# Patient Record
Sex: Female | Born: 1985 | Race: White | Hispanic: No | Marital: Married | Smoking: Never smoker
Health system: Southern US, Community
[De-identification: ages and names within clinical notes are randomized; demographics above are authoritative.]

## PROBLEM LIST (undated history)

## (undated) ENCOUNTER — Inpatient Hospital Stay (HOSPITAL_COMMUNITY): Payer: Self-pay

## (undated) DIAGNOSIS — D649 Anemia, unspecified: Secondary | ICD-10-CM

## (undated) DIAGNOSIS — Z87440 Personal history of urinary (tract) infections: Secondary | ICD-10-CM

## (undated) DIAGNOSIS — R87629 Unspecified abnormal cytological findings in specimens from vagina: Secondary | ICD-10-CM

## (undated) HISTORY — PX: NO PAST SURGERIES: SHX2092

---

## 2015-05-06 ENCOUNTER — Emergency Department (HOSPITAL_COMMUNITY): Payer: 59 | Admitting: Anesthesiology

## 2015-05-06 ENCOUNTER — Emergency Department (HOSPITAL_COMMUNITY): Payer: 59

## 2015-05-06 ENCOUNTER — Observation Stay (HOSPITAL_COMMUNITY)
Admission: EM | Admit: 2015-05-06 | Discharge: 2015-05-07 | Disposition: A | Discharge status: Home / Self Care | Payer: 59 | Attending: General Surgery | Admitting: General Surgery

## 2015-05-06 ENCOUNTER — Encounter (HOSPITAL_COMMUNITY): Payer: Self-pay | Admitting: Emergency Medicine

## 2015-05-06 ENCOUNTER — Encounter (HOSPITAL_COMMUNITY)
Admission: EM | Disposition: A | Discharge status: Home / Self Care | Payer: Self-pay | Source: Home / Self Care | Attending: Emergency Medicine

## 2015-05-06 DIAGNOSIS — K3580 Unspecified acute appendicitis: Principal | ICD-10-CM | POA: Insufficient documentation

## 2015-05-06 DIAGNOSIS — Z9049 Acquired absence of other specified parts of digestive tract: Secondary | ICD-10-CM

## 2015-05-06 HISTORY — DX: Personal history of urinary (tract) infections: Z87.440

## 2015-05-06 HISTORY — PX: APPENDECTOMY: SHX54

## 2015-05-06 HISTORY — PX: LAPAROSCOPIC APPENDECTOMY: SHX408

## 2015-05-06 LAB — URINALYSIS, ROUTINE W REFLEX MICROSCOPIC
GLUCOSE, UA: NEGATIVE mg/dL
Hgb urine dipstick: NEGATIVE
Leukocytes, UA: NEGATIVE
Nitrite: NEGATIVE
PH: 6.5 (ref 5.0–8.0)
Protein, ur: NEGATIVE mg/dL
Specific Gravity, Urine: 1.022 (ref 1.005–1.030)
Urobilinogen, UA: 1 mg/dL (ref 0.0–1.0)

## 2015-05-06 LAB — CBC
HCT: 41.9 % (ref 36.0–46.0)
Hemoglobin: 14 g/dL (ref 12.0–15.0)
MCH: 31 pg (ref 26.0–34.0)
MCHC: 33.4 g/dL (ref 30.0–36.0)
MCV: 92.7 fL (ref 78.0–100.0)
Platelets: 162 10*3/uL (ref 150–400)
RBC: 4.52 MIL/uL (ref 3.87–5.11)
RDW: 12.5 % (ref 11.5–15.5)
WBC: 5.9 10*3/uL (ref 4.0–10.5)

## 2015-05-06 LAB — COMPREHENSIVE METABOLIC PANEL WITH GFR
ALT: 23 U/L (ref 14–54)
AST: 30 U/L (ref 15–41)
Albumin: 3.8 g/dL (ref 3.5–5.0)
Alkaline Phosphatase: 52 U/L (ref 38–126)
Anion gap: 13 (ref 5–15)
BUN: <5 mg/dL — ABNORMAL LOW (ref 6–20)
CO2: 22 mmol/L (ref 22–32)
Calcium: 9.3 mg/dL (ref 8.9–10.3)
Chloride: 105 mmol/L (ref 101–111)
Creatinine, Ser: 0.76 mg/dL (ref 0.44–1.00)
GFR calc Af Amer: >60 mL/min (ref >60)
GFR calc non Af Amer: >60 mL/min (ref >60)
Glucose, Bld: 88 mg/dL (ref 65–99)
Potassium: 3.8 mmol/L (ref 3.5–5.1)
Sodium: 140 mmol/L (ref 135–145)
Total Bilirubin: 0.7 mg/dL (ref 0.3–1.2)
Total Protein: 6.6 g/dL (ref 6.5–8.1)

## 2015-05-06 LAB — I-STAT BETA HCG BLOOD, ED (MC, WL, AP ONLY): I-stat hCG, quantitative: <5 m[IU]/mL (ref <5)

## 2015-05-06 LAB — LIPASE, BLOOD: Lipase: 22 U/L (ref 11–51)

## 2015-05-06 SURGERY — APPENDECTOMY, LAPAROSCOPIC
Anesthesia: General | Site: Abdomen

## 2015-05-06 MED ORDER — SUCCINYLCHOLINE CHLORIDE 20 MG/ML IJ SOLN
INTRAMUSCULAR | Status: DC | PRN
  Administered 2015-05-06: 80 mg via INTRAVENOUS

## 2015-05-06 MED ORDER — PANTOPRAZOLE SODIUM 40 MG IV SOLR
40.0000 mg | Freq: Every day | INTRAVENOUS | Status: DC
  Administered 2015-05-06: 40 mg via INTRAVENOUS
  Filled 2015-05-06: qty 40

## 2015-05-06 MED ORDER — ONDANSETRON 4 MG PO TBDP
4.0000 mg | ORAL_TABLET | Freq: Four times a day (QID) | ORAL | Status: DC | PRN
  Administered 2015-05-07: 4 mg via ORAL
  Filled 2015-05-06: qty 1

## 2015-05-06 MED ORDER — ONDANSETRON HCL 4 MG/2ML IJ SOLN: 4.0000 mg | Freq: Four times a day (QID) | INTRAMUSCULAR | Status: DC | PRN

## 2015-05-06 MED ORDER — HYDROMORPHONE HCL 1 MG/ML IJ SOLN
INTRAMUSCULAR | Status: AC
  Filled 2015-05-06: qty 1

## 2015-05-06 MED ORDER — LACTATED RINGERS IV SOLN
INTRAVENOUS | Status: DC
  Administered 2015-05-06: 18:00:00 via INTRAVENOUS

## 2015-05-06 MED ORDER — ARTIFICIAL TEARS OP OINT
TOPICAL_OINTMENT | OPHTHALMIC | Status: DC | PRN
  Administered 2015-05-06: 1 via OPHTHALMIC

## 2015-05-06 MED ORDER — KETOROLAC TROMETHAMINE 30 MG/ML IJ SOLN
30.0000 mg | Freq: Once | INTRAMUSCULAR | Status: AC
  Administered 2015-05-06: 30 mg via INTRAVENOUS

## 2015-05-06 MED ORDER — MIDAZOLAM HCL 5 MG/5ML IJ SOLN
INTRAMUSCULAR | Status: DC | PRN
  Administered 2015-05-06: 2 mg via INTRAVENOUS

## 2015-05-06 MED ORDER — OXYCODONE HCL 5 MG PO TABS
5.0000 mg | ORAL_TABLET | ORAL | Status: DC | PRN
  Administered 2015-05-06: 5 mg via ORAL
  Administered 2015-05-07 (×4): 10 mg via ORAL
  Filled 2015-05-06 (×4): qty 2

## 2015-05-06 MED ORDER — ROCURONIUM BROMIDE 100 MG/10ML IV SOLN
INTRAVENOUS | Status: DC | PRN
  Administered 2015-05-06: 20 mg via INTRAVENOUS

## 2015-05-06 MED ORDER — DIPHENHYDRAMINE HCL 50 MG/ML IJ SOLN: 25.0000 mg | Freq: Four times a day (QID) | INTRAMUSCULAR | Status: DC | PRN

## 2015-05-06 MED ORDER — LACTATED RINGERS IV SOLN
INTRAVENOUS | Status: DC | PRN
  Administered 2015-05-06: 18:00:00 via INTRAVENOUS

## 2015-05-06 MED ORDER — HYDROMORPHONE HCL 1 MG/ML IJ SOLN
0.2500 mg | INTRAMUSCULAR | Status: DC | PRN
  Administered 2015-05-06 (×4): 0.5 mg via INTRAVENOUS

## 2015-05-06 MED ORDER — DIPHENHYDRAMINE HCL 25 MG PO CAPS: 25.0000 mg | ORAL_CAPSULE | Freq: Four times a day (QID) | ORAL | Status: DC | PRN

## 2015-05-06 MED ORDER — MONTELUKAST SODIUM 10 MG PO TABS
10.0000 mg | ORAL_TABLET | Freq: Every day | ORAL | Status: DC
  Administered 2015-05-06: 10 mg via ORAL
  Filled 2015-05-06: qty 1

## 2015-05-06 MED ORDER — LIDOCAINE HCL (CARDIAC) 20 MG/ML IV SOLN
INTRAVENOUS | Status: DC | PRN
  Administered 2015-05-06: 60 mg via INTRAVENOUS

## 2015-05-06 MED ORDER — BUPIVACAINE-EPINEPHRINE (PF) 0.25% -1:200000 IJ SOLN
INTRAMUSCULAR | Status: AC
  Filled 2015-05-06: qty 30

## 2015-05-06 MED ORDER — DEXAMETHASONE SODIUM PHOSPHATE 10 MG/ML IJ SOLN
INTRAMUSCULAR | Status: AC
  Filled 2015-05-06: qty 1

## 2015-05-06 MED ORDER — NEOSTIGMINE METHYLSULFATE 10 MG/10ML IV SOLN
INTRAVENOUS | Status: DC | PRN
  Administered 2015-05-06: 4 mg via INTRAVENOUS

## 2015-05-06 MED ORDER — KCL IN DEXTROSE-NACL 20-5-0.45 MEQ/L-%-% IV SOLN
INTRAVENOUS | Status: AC
  Filled 2015-05-06: qty 1000

## 2015-05-06 MED ORDER — IOHEXOL 300 MG/ML  SOLN
80.0000 mL | Freq: Once | INTRAMUSCULAR | Status: AC | PRN
  Administered 2015-05-06: 100 mL via INTRAVENOUS

## 2015-05-06 MED ORDER — OXYCODONE HCL 5 MG PO TABS
ORAL_TABLET | ORAL | Status: AC
  Filled 2015-05-06: qty 1

## 2015-05-06 MED ORDER — KCL IN DEXTROSE-NACL 20-5-0.45 MEQ/L-%-% IV SOLN
INTRAVENOUS | Status: DC
  Administered 2015-05-06: 20:00:00 via INTRAVENOUS

## 2015-05-06 MED ORDER — FENTANYL CITRATE (PF) 100 MCG/2ML IJ SOLN
INTRAMUSCULAR | Status: DC | PRN
  Administered 2015-05-06: 100 ug via INTRAVENOUS
  Administered 2015-05-06 (×3): 50 ug via INTRAVENOUS

## 2015-05-06 MED ORDER — DEXTROSE 5 % IV SOLN
1.0000 g | INTRAVENOUS | Status: DC
  Administered 2015-05-06: 1 g via INTRAVENOUS
  Filled 2015-05-06 (×2): qty 10

## 2015-05-06 MED ORDER — METRONIDAZOLE IN NACL 5-0.79 MG/ML-% IV SOLN
500.0000 mg | Freq: Three times a day (TID) | INTRAVENOUS | Status: DC
  Administered 2015-05-06 – 2015-05-07 (×3): 500 mg via INTRAVENOUS
  Filled 2015-05-06 (×5): qty 100

## 2015-05-06 MED ORDER — NEOSTIGMINE METHYLSULFATE 10 MG/10ML IV SOLN
INTRAVENOUS | Status: AC
  Filled 2015-05-06: qty 1

## 2015-05-06 MED ORDER — ACETAMINOPHEN 650 MG RE SUPP: 650.0000 mg | Freq: Four times a day (QID) | RECTAL | Status: DC | PRN

## 2015-05-06 MED ORDER — KETOROLAC TROMETHAMINE 30 MG/ML IJ SOLN
INTRAMUSCULAR | Status: AC
  Filled 2015-05-06: qty 1

## 2015-05-06 MED ORDER — ACETAMINOPHEN 325 MG PO TABS
650.0000 mg | ORAL_TABLET | Freq: Four times a day (QID) | ORAL | Status: DC | PRN
  Administered 2015-05-06: 650 mg via ORAL
  Filled 2015-05-06: qty 2

## 2015-05-06 MED ORDER — GLYCOPYRROLATE 0.2 MG/ML IJ SOLN
INTRAMUSCULAR | Status: DC | PRN
  Administered 2015-05-06: 0.6 mg via INTRAVENOUS

## 2015-05-06 MED ORDER — METHOCARBAMOL 500 MG PO TABS
500.0000 mg | ORAL_TABLET | Freq: Four times a day (QID) | ORAL | Status: DC | PRN
  Administered 2015-05-06 – 2015-05-07 (×2): 500 mg via ORAL
  Filled 2015-05-06 (×2): qty 1

## 2015-05-06 MED ORDER — PROPOFOL 10 MG/ML IV BOLUS
INTRAVENOUS | Status: AC
  Filled 2015-05-06: qty 20

## 2015-05-06 MED ORDER — FENTANYL CITRATE (PF) 250 MCG/5ML IJ SOLN
INTRAMUSCULAR | Status: AC
  Filled 2015-05-06: qty 5

## 2015-05-06 MED ORDER — BUPIVACAINE-EPINEPHRINE 0.25% -1:200000 IJ SOLN
INTRAMUSCULAR | Status: DC | PRN
  Administered 2015-05-06: 14 mL

## 2015-05-06 MED ORDER — MORPHINE SULFATE (PF) 2 MG/ML IV SOLN: 2.0000 mg | INTRAVENOUS | Status: DC | PRN

## 2015-05-06 MED ORDER — MIDAZOLAM HCL 2 MG/2ML IJ SOLN
INTRAMUSCULAR | Status: AC
  Filled 2015-05-06: qty 4

## 2015-05-06 MED ORDER — SODIUM CHLORIDE 0.9 % IR SOLN
Status: DC | PRN
  Administered 2015-05-06: 1000 mL

## 2015-05-06 MED ORDER — ENOXAPARIN SODIUM 40 MG/0.4ML ~~LOC~~ SOLN
40.0000 mg | SUBCUTANEOUS | Status: DC
  Administered 2015-05-07: 40 mg via SUBCUTANEOUS
  Filled 2015-05-06: qty 0.4

## 2015-05-06 MED ORDER — PROPOFOL 10 MG/ML IV BOLUS
INTRAVENOUS | Status: DC | PRN
  Administered 2015-05-06: 150 mg via INTRAVENOUS

## 2015-05-06 MED ORDER — DEXAMETHASONE SODIUM PHOSPHATE 10 MG/ML IJ SOLN
INTRAMUSCULAR | Status: DC | PRN
  Administered 2015-05-06: 10 mg via INTRAVENOUS

## 2015-05-06 MED ORDER — PROMETHAZINE HCL 25 MG/ML IJ SOLN: 6.2500 mg | INTRAMUSCULAR | Status: DC | PRN

## 2015-05-06 MED ORDER — ONDANSETRON HCL 4 MG/2ML IJ SOLN
INTRAMUSCULAR | Status: DC | PRN
  Administered 2015-05-06: 4 mg via INTRAVENOUS

## 2015-05-06 MED ORDER — GLYCOPYRROLATE 0.2 MG/ML IJ SOLN
INTRAMUSCULAR | Status: AC
  Filled 2015-05-06: qty 3

## 2015-05-06 MED ORDER — NORGESTIMATE-ETH ESTRADIOL 0.25-35 MG-MCG PO TABS: 1.0000 | ORAL_TABLET | Freq: Every day | ORAL | Status: DC

## 2015-05-06 MED ORDER — SODIUM CHLORIDE 0.9 % IV BOLUS (SEPSIS)
1000.0000 mL | Freq: Once | INTRAVENOUS | Status: AC
  Administered 2015-05-06: 1000 mL via INTRAVENOUS

## 2015-05-06 MED ORDER — ONDANSETRON HCL 4 MG/2ML IJ SOLN
INTRAMUSCULAR | Status: AC
  Filled 2015-05-06: qty 2

## 2015-05-06 SURGICAL SUPPLY — 42 items
APPLIER CLIP ROT 10 11.4 M/L (STAPLE)
BLADE SURG ROTATE 9660 (MISCELLANEOUS) IMPLANT
CANISTER SUCTION 2500CC (MISCELLANEOUS) ×3 IMPLANT
CHLORAPREP W/TINT 26ML (MISCELLANEOUS) ×3 IMPLANT
CLIP APPLIE ROT 10 11.4 M/L (STAPLE) IMPLANT
COVER SURGICAL LIGHT HANDLE (MISCELLANEOUS) ×3 IMPLANT
CUTTER LINEAR ENDO 35 ART THIN (STAPLE) ×3 IMPLANT
CUTTER LINEAR ENDO 35 ETS (STAPLE) IMPLANT
DECANTER SPIKE VIAL GLASS SM (MISCELLANEOUS) ×3 IMPLANT
ELECT REM PT RETURN 9FT ADLT (ELECTROSURGICAL) ×3
ELECTRODE REM PT RTRN 9FT ADLT (ELECTROSURGICAL) ×1 IMPLANT
GLOVE BIO SURGEON STRL SZ8 (GLOVE) ×3 IMPLANT
GLOVE BIOGEL PI IND STRL 7.0 (GLOVE) ×1 IMPLANT
GLOVE BIOGEL PI IND STRL 8 (GLOVE) ×1 IMPLANT
GLOVE BIOGEL PI INDICATOR 7.0 (GLOVE) ×2
GLOVE BIOGEL PI INDICATOR 8 (GLOVE) ×2
GOWN STRL REUS W/ TWL LRG LVL3 (GOWN DISPOSABLE) ×2 IMPLANT
GOWN STRL REUS W/ TWL XL LVL3 (GOWN DISPOSABLE) ×1 IMPLANT
GOWN STRL REUS W/TWL LRG LVL3 (GOWN DISPOSABLE) ×4
GOWN STRL REUS W/TWL XL LVL3 (GOWN DISPOSABLE) ×2
KIT BASIN OR (CUSTOM PROCEDURE TRAY) ×3 IMPLANT
KIT ROOM TURNOVER OR (KITS) ×3 IMPLANT
LIQUID BAND (GAUZE/BANDAGES/DRESSINGS) ×3 IMPLANT
NEEDLE 22X1 1/2 (OR ONLY) (NEEDLE) ×3 IMPLANT
NS IRRIG 1000ML POUR BTL (IV SOLUTION) ×3 IMPLANT
PAD ARMBOARD 7.5X6 YLW CONV (MISCELLANEOUS) ×6 IMPLANT
POUCH SPECIMEN RETRIEVAL 10MM (ENDOMECHANICALS) ×3 IMPLANT
RELOAD /EVU35 (ENDOMECHANICALS) IMPLANT
RELOAD CUTTER ETS 35MM STAND (ENDOMECHANICALS) IMPLANT
SCALPEL HARMONIC ACE (MISCELLANEOUS) ×3 IMPLANT
SET IRRIG TUBING LAPAROSCOPIC (IRRIGATION / IRRIGATOR) ×3 IMPLANT
SLEEVE ENDOPATH XCEL 5M (ENDOMECHANICALS) ×3 IMPLANT
SPECIMEN JAR SMALL (MISCELLANEOUS) ×3 IMPLANT
SUT VIC AB 4-0 PS2 27 (SUTURE) ×3 IMPLANT
TOWEL OR 17X24 6PK STRL BLUE (TOWEL DISPOSABLE) ×3 IMPLANT
TOWEL OR 17X26 10 PK STRL BLUE (TOWEL DISPOSABLE) ×3 IMPLANT
TRAY FOLEY CATH 16FR SILVER (SET/KITS/TRAYS/PACK) ×3 IMPLANT
TRAY LAPAROSCOPIC MC (CUSTOM PROCEDURE TRAY) ×3 IMPLANT
TROCAR XCEL 12X100 BLDLESS (ENDOMECHANICALS) ×3 IMPLANT
TROCAR XCEL BLUNT TIP 100MML (ENDOMECHANICALS) ×3 IMPLANT
TROCAR XCEL NON-BLD 5MMX100MML (ENDOMECHANICALS) ×3 IMPLANT
TUBING INSUFFLATION (TUBING) ×3 IMPLANT

## 2015-05-06 NOTE — Anesthesia Procedure Notes (Signed)
Procedure Name: Intubation Date/Time: 05/06/2015 6:33 PM Performed by: Molli HazardGORDON, Keyaira Clapham M Pre-anesthesia Checklist: Patient identified, Timeout performed, Emergency Drugs available, Suction available and Patient being monitored Patient Re-evaluated:Patient Re-evaluated prior to inductionOxygen Delivery Method: Circle system utilized Preoxygenation: Pre-oxygenation with 100% oxygen Intubation Type: IV induction, Rapid sequence and Cricoid Pressure applied Laryngoscope Size: Mac and 3 Grade View: Grade I Tube type: Oral Tube size: 7.0 mm Number of attempts: 1 Airway Equipment and Method: Stylet Placement Confirmation: ETT inserted through vocal cords under direct vision,  breath sounds checked- equal and bilateral and positive ETCO2 Secured at: 21 cm Tube secured with: Tape Dental Injury: Teeth and Oropharynx as per pre-operative assessment

## 2015-05-06 NOTE — Progress Notes (Signed)
Received pt from PACU via stretcher, awake

## 2015-05-06 NOTE — Anesthesia Preprocedure Evaluation (Signed)
Anesthesia Evaluation  Patient identified by MRN, date of birth, ID band Patient awake    Reviewed: Allergy & Precautions, NPO status , Patient's Chart, lab work & pertinent test results  Airway Mallampati: II  TM Distance: >3 FB Neck ROM: Full    Dental no notable dental hx.    Pulmonary neg pulmonary ROS,    Pulmonary exam normal breath sounds clear to auscultation       Cardiovascular negative cardio ROS Normal cardiovascular exam Rhythm:Regular Rate:Normal     Neuro/Psych negative neurological ROS  negative psych ROS   GI/Hepatic negative GI ROS, Neg liver ROS,   Endo/Other  negative endocrine ROS  Renal/GU negative Renal ROS  negative genitourinary   Musculoskeletal negative musculoskeletal ROS (+)   Abdominal   Peds negative pediatric ROS (+)  Hematology negative hematology ROS (+)   Anesthesia Other Findings   Reproductive/Obstetrics negative OB ROS                             Anesthesia Physical Anesthesia Plan  ASA: I  Anesthesia Plan: General   Post-op Pain Management:    Induction: Intravenous and Rapid sequence  Airway Management Planned: Oral ETT  Additional Equipment:   Intra-op Plan:   Post-operative Plan: Extubation in OR  Informed Consent: I have reviewed the patients History and Physical, chart, labs and discussed the procedure including the risks, benefits and alternatives for the proposed anesthesia with the patient or authorized representative who has indicated his/her understanding and acceptance.   Dental advisory given  Plan Discussed with: CRNA and Surgeon  Anesthesia Plan Comments:         Anesthesia Quick Evaluation  

## 2015-05-06 NOTE — Op Note (Signed)
05/06/2015  7:27 PM  PATIENT:  Diane [name]  29 y.o. female  PRE-OPERATIVE DIAGNOSIS:  Acute appendicitis  POST-OPERATIVE DIAGNOSIS:  Acute appendicitis  PROCEDURE:  Procedure(s): APPENDECTOMY LAPAROSCOPIC  SURGEON:  Surgeon(s): Violeta GelinasBurke Neveah Bang, MD  ASSISTANTS: none   ANESTHESIA:   local and general  EBL:  Total I/O In: -  Out: 325 [Urine:300; Blood:25]  BLOOD ADMINISTERED:none  DRAINS: none   SPECIMEN:  Excision  DISPOSITION OF SPECIMEN:  PATHOLOGY  COUNTS:  YES  DICTATION: .Dragon Dictation Findings: Acutely inflamed appendix without perforation  Procedure in detail: Patient was identified in the preop holding area. Informed consent was obtained. She received intravenous antibiotics. She was brought to the operative room and general endotracheal anesthesia was administered by the anesthesia staff. Foley catheter was placed by nursing. Abdomen was prepped and draped in sterile fashion. Time out procedure was performed.The infraumbilical region was infiltrated with local. Infraumbilical incision was made. Subcutaneous tissues were dissected down revealing the anterior fascia. This was divided sharply along the midline. Peritoneal cavity was entered under direct vision without complication. A 0 Vicryl pursestring was placed around the fascial opening. Hassan trocar was inserted into the abdomen. The abdomen was insufflated with carbon dioxide in standard fashion. Under direct vision a 5 mm left lower quadrant and 5 mm right mid abdomen port were placed. Local was used at each port site. Laparoscopic exploration revealed an acutely inflamed, but not perforated appendix. The mesoappendix was divided with the harmonic scalpel achieving excellent hemostasis. The base of the appendix was then divided with Endo GIA achieving an excellent staple line. The appendix was placed in a bag and removed from the abdomen. The abdomen was copiously irrigated and the irrigation returned clear.  Staple line was intact. There was no bleeding. Ports were removed under direct vision. Pneumoperitoneum was released. Infraumbilical fascia was closed by tying the pursestring. All 3 wounds were copiously irrigated, hemostasis was ensured, and then closed with running 4-0 Vicryl subcuticular followed by liquid band. All counts were correct. Patient tolerated procedure well without apparent complications and was taken recovery in stable condition.  PATIENT DISPOSITION:  PACU - hemodynamically stable.   Delay start of Pharmacological VTE agent (>24hrs) due to surgical blood loss or risk of bleeding:  no  Violeta GelinasBurke Lamyia Cdebaca, MD, MPH, FACS Pager: [phone]  10/31/20167:27 PM

## 2015-05-06 NOTE — Interval H&P Note (Signed)
History and Physical Interval Note:Please see Dr. Tawana ScaleWilson's H&P. I examined her & agree with his assessment and plan. Acute appendicitis. For laparoscopic appendectomy. Procedure, risks, and benefits discussed with her and she agrees.  05/06/2015 6:12 PM  Diane [name]  has presented today for surgery, with the diagnosis of Appendicitis  The various methods of treatment have been discussed with the patient and family. After consideration of risks, benefits and other options for treatment, the patient has consented to  Procedure(s): APPENDECTOMY LAPAROSCOPIC (N/A) as a surgical intervention .  The patient's history has been reviewed, patient examined, no change in status, stable for surgery.  I have reviewed the patient's chart and labs.  Questions were answered to the patient's satisfaction.     Diane [name] E

## 2015-05-06 NOTE — H&P (Signed)
Diane [name] is an 29 y.o. female.   Chief Complaint: abd pain HPI: 29 yo wf who developed periumbilical pain yesterday while traveling back from a wedding in [place] state. Pain has been constant and progressive. +nausea. This am pain became localized to RLQ and came to ED. Had some sweats last night. Driving to ED and hitting bumps, etc made pain worse. No prior symptoms. Has had a cough as well past several days - no fever, some productive sputum. No sore throat. Last bm yesterday. Normal urination. No discharge.   Just takes OCP and allergy med  History reviewed. No pertinent past medical history.  History reviewed. No pertinent past surgical history.  No family history on file. Social History:  reports that she has never smoked. She does not have any smokeless tobacco history on file. She reports that she drinks alcohol. Her drug history is not on file.  Allergies: No Known Allergies   (Not in a hospital admission)  Results for orders placed or performed during the hospital encounter of 05/06/15 (from the past 48 hour(s))  Lipase, blood     Status: None   Collection Time: 05/06/15 11:20 AM  Result Value Ref Range   Lipase 22 11 - 51 U/L    Comment: Please note change in reference range.  Comprehensive metabolic panel     Status: Abnormal   Collection Time: 05/06/15 11:20 AM  Result Value Ref Range   Sodium 140 135 - 145 mmol/L   Potassium 3.8 3.5 - 5.1 mmol/L   Chloride 105 101 - 111 mmol/L   CO2 22 22 - 32 mmol/L   Glucose, Bld 88 65 - 99 mg/dL   BUN <5 (L) 6 - 20 mg/dL   Creatinine, Ser 0.76 0.44 - 1.00 mg/dL   Calcium 9.3 8.9 - 10.3 mg/dL   Total Protein 6.6 6.5 - 8.1 g/dL   Albumin 3.8 3.5 - 5.0 g/dL   AST 30 15 - 41 U/L   ALT 23 14 - 54 U/L   Alkaline Phosphatase 52 38 - 126 U/L   Total Bilirubin 0.7 0.3 - 1.2 mg/dL   GFR calc non Af Amer >60 >60 mL/min   GFR calc Af Amer >60 >60 mL/min    Comment: (NOTE) The eGFR has been calculated using the CKD EPI  equation. This calculation has not been validated in all clinical situations. eGFR's persistently <60 mL/min signify possible Chronic Kidney Disease.    Anion gap 13 5 - 15  CBC     Status: None   Collection Time: 05/06/15 11:20 AM  Result Value Ref Range   WBC 5.9 4.0 - 10.5 K/uL   RBC 4.52 3.87 - 5.11 MIL/uL   Hemoglobin 14.0 12.0 - 15.0 g/dL   HCT 41.9 36.0 - 46.0 %   MCV 92.7 78.0 - 100.0 fL   MCH 31.0 26.0 - 34.0 pg   MCHC 33.4 30.0 - 36.0 g/dL   RDW 12.5 11.5 - 15.5 %   Platelets 162 150 - 400 K/uL  I-Stat beta hCG blood, ED ([facility], [facility], [facility] only)     Status: None   Collection Time: 05/06/15 11:20 AM  Result Value Ref Range   I-stat hCG, quantitative <5.0 <5 mIU/mL   Comment 3            Comment:   GEST. AGE      CONC.  (mIU/mL)   <=1 WEEK        5 - 50  2 WEEKS       50 - 500     3 WEEKS       100 - 10,000     4 WEEKS     1,000 - 30,000        FEMALE AND NON-PREGNANT FEMALE:     LESS THAN 5 mIU/mL   Urinalysis, Routine w reflex microscopic (not at [facility])     Status: Abnormal   Collection Time: 05/06/15 12:37 PM  Result Value Ref Range   Color, Urine YELLOW YELLOW   APPearance CLEAR CLEAR   Specific Gravity, Urine 1.022 1.005 - 1.030   pH 6.5 5.0 - 8.0   Glucose, UA NEGATIVE NEGATIVE mg/dL   Hgb urine dipstick NEGATIVE NEGATIVE   Bilirubin Urine SMALL (A) NEGATIVE   Ketones, ur >80 (A) NEGATIVE mg/dL   Protein, ur NEGATIVE NEGATIVE mg/dL   Urobilinogen, UA 1.0 0.0 - 1.0 mg/dL   Nitrite NEGATIVE NEGATIVE   Leukocytes, UA NEGATIVE NEGATIVE    Comment: MICROSCOPIC NOT DONE ON URINES WITH NEGATIVE PROTEIN, BLOOD, LEUKOCYTES, NITRITE, OR GLUCOSE <1000 mg/dL.   Ct Abdomen Pelvis W Contrast  05/06/2015  CLINICAL DATA:  Initial encounter for right lower quadrant abdominal pain since last night. EXAM: CT ABDOMEN AND PELVIS WITH CONTRAST TECHNIQUE: Multidetector CT imaging of the abdomen and pelvis was performed using the standard protocol following bolus administration of  intravenous contrast. CONTRAST:  131m OMNIPAQUE IOHEXOL 300 MG/ML  SOLN COMPARISON:  None. FINDINGS: Lower chest:  Unremarkable. Hepatobiliary: No focal abnormality within the liver parenchyma. There is no evidence for gallstones, gallbladder wall thickening, or pericholecystic fluid. No intrahepatic or extrahepatic biliary dilation. Pancreas: No focal mass lesion. No dilatation of the main duct. No intraparenchymal cyst. No peripancreatic edema. Spleen: No splenomegaly. No focal mass lesion. Adrenals/Urinary Tract: No adrenal nodule or mass. Kidneys are normal bilaterally. No evidence for hydroureter. The urinary bladder appears normal for the degree of distention. Stomach/Bowel: Stomach is nondistended. No gastric wall thickening. No evidence of outlet obstruction. Duodenum is normally positioned as is the ligament of Treitz. No small bowel wall thickening. No small bowel dilatation. Terminal ileum is not well seen. The appendix appears thickened, measuring up to 9 mm in diameter. Appendiceal wall is ill-defined and there appears to be slight hypovolemia in the wall of the appendix. Appendix is straightened and well seen on coronal images 23 and 24 of series 5. No gross colonic mass. No colonic wall thickening. No substantial diverticular change. Vascular/Lymphatic: No abdominal aortic aneurysm. No abdominal atherosclerotic calcification. There is no gastrohepatic or hepatoduodenal ligament lymphadenopathy. No intraperitoneal or retroperitoneal lymphadenopy. No pelvic sidewall lymphadenopathy. Reproductive: Uterus is unremarkable.  There is no adnexal mass. Other: Trace free fluid is seen in the cul-de-sac. Musculoskeletal: Bone windows reveal no worrisome lytic or sclerotic osseous lesions. IMPRESSION: Mild thickening of the appendiceal diameter associated with a straightened configuration and ill definition of the wall. Together, these imaging features are suspicious for acute appendicitis. No evidence for  perforation or abscess at this time. I discussed these findings by telephone with the LJohnn Hai, the PA caring for the patient, the as , slow at 1624 hours on 05/06/2015. Electronically Signed   By: EMisty StanleyM.D.   On: 05/06/2015 16:24    Review of Systems  Constitutional: Negative for weight loss.       Clammy  HENT: Negative for nosebleeds.   Eyes: Negative for blurred vision.  Respiratory: Positive for cough and sputum production. Negative for  shortness of breath.   Cardiovascular: Negative for chest pain, palpitations, orthopnea and PND.       Denies DOE  Gastrointestinal: Positive for nausea and abdominal pain.  Genitourinary: Negative for dysuria, urgency, frequency and hematuria.  Musculoskeletal: Negative.   Skin: Negative for itching and rash.  Neurological: Negative for dizziness, focal weakness, seizures, loss of consciousness and headaches.  Endo/Heme/Allergies: Does not bruise/bleed easily.  Psychiatric/Behavioral: The patient is not nervous/anxious.     Blood pressure 108/68, pulse 76, temperature 98.5 F (36.9 C), temperature source Oral, resp. rate 16, height '5\' 3"'  (1.6 m), weight 52.164 kg (115 lb), SpO2 99 %. Physical Exam  Vitals reviewed. Constitutional: She is oriented to person, place, and time. She appears well-developed and well-nourished. No distress.  Nontoxic, occasional cough  HENT:  Head: Normocephalic and atraumatic.  Right Ear: External ear normal.  Left Ear: External ear normal.  Eyes: Conjunctivae are normal. No scleral icterus.  Neck: Normal range of motion. Neck supple. No tracheal deviation present. No thyromegaly present.  Cardiovascular: Normal rate and normal heart sounds.   Respiratory: Effort normal and breath sounds normal. No stridor. No respiratory distress. She has no wheezes.  GI: Soft. She exhibits no distension. There is tenderness. There is guarding and tenderness at McBurney's point. There is no rigidity and no rebound. No  hernia.    Musculoskeletal: She exhibits no edema or tenderness.  Lymphadenopathy:    She has no cervical adenopathy.  Neurological: She is alert and oriented to person, place, and time. She exhibits normal muscle tone.  Skin: Skin is warm and dry. No rash noted. She is not diaphoretic. No erythema. No pallor.  Psychiatric: She has a normal mood and affect. Her behavior is normal. Judgment and thought content normal.     Assessment/Plan Acute appendicitis Cough/cold  We discussed the etiology and management of acute appendicitis. We discussed operative and nonoperative management.  I recommended operative management along with IV antibiotics.  We discussed laparoscopic appendectomy. We discussed the risk and benefits of surgery including but not limited to bleeding, infection, injury to surrounding structures, need to convert to an open procedure, blood clot formation, post operative abscess or wound infection, staple line complications such as leak or bleeding, hernia formation, post operative ileus, need for additional procedures, anesthesia complications, and the typical postoperative course. I explained that the patient should expect a good improvement in their symptoms.  Dr Grandville Silos will be doing her surgery later this pm. All of her questions asked and answered.   Leighton Ruff. Redmond Pulling, MD, FACS General, Bariatric, & Minimally Invasive Surgery [facility], [place]   [facility] M 05/06/2015, 5:42 PM

## 2015-05-06 NOTE — Transfer of Care (Signed)
Immediate Anesthesia Transfer of Care Note  Patient: Diane AgarJennifer [name]  Procedure(s) Performed: Procedure(s): APPENDECTOMY LAPAROSCOPIC (N/A)  Patient Location: PACU  Anesthesia Type:General  Level of Consciousness: awake, alert  and oriented  Airway & Oxygen Therapy: Patient connected to nasal cannula oxygen  Post-op Assessment: Report given to RN and Post -op Vital signs reviewed and stable  Post vital signs: Reviewed and stable  Last Vitals:  Filed Vitals:   05/06/15 1936  BP: 102/78  Pulse: 100  Temp: 36.4 C  Resp: 20    Complications: No apparent anesthesia complications

## 2015-05-06 NOTE — ED Notes (Signed)
Pt from home for eval of LUQ abd pain that started yesterday with nausea, denies any fever or diarrhea. States hx of UTI but feels this is different. No abd distention, LBM on 05/05/15. nad noted. Skin warm and dry.

## 2015-05-06 NOTE — Anesthesia Postprocedure Evaluation (Signed)
  Anesthesia Post-op Note  Patient: Diane AgarJennifer [name]  Procedure(s) Performed: Procedure(s) (LRB): APPENDECTOMY LAPAROSCOPIC (N/A)  Patient Location: PACU  Anesthesia Type: General  Level of Consciousness: awake and alert   Airway and Oxygen Therapy: Patient Spontanous Breathing  Post-op Pain: mild  Post-op Assessment: Post-op Vital signs reviewed, Patient's Cardiovascular Status Stable, Respiratory Function Stable, Patent Airway and No signs of Nausea or vomiting  Last Vitals:  Filed Vitals:   05/06/15 2000  BP:   Pulse: 77  Temp: 36.8 C  Resp: 18    Post-op Vital Signs: stable   Complications: No apparent anesthesia complications

## 2015-05-06 NOTE — ED Provider Notes (Signed)
CSN: [id number]     Arrival date & time 05/06/15  1101 History   First MD Initiated Contact with Patient 05/06/15 1137     Chief Complaint  Patient presents with  . Abdominal Pain     (Consider location/radiation/quality/duration/timing/severity/associated sxs/prior Treatment) Patient is a 29 y.o. female presenting with abdominal pain. The history is provided by the patient. No language interpreter was used.  Abdominal Pain Pain location:  RLQ Pain quality: aching   Pain radiates to:  Does not radiate Pain severity:  Moderate Onset quality:  Gradual Duration:  1 day Timing:  Constant Progression:  Worsening Chronicity:  New Context: not alcohol use, not previous surgeries and not recent illness   Relieved by:  Nothing Worsened by:  Nothing tried Ineffective treatments:  None tried Associated symptoms: anorexia and nausea   Associated symptoms: no vomiting   Risk factors: not pregnant    Pt reports she began having pain in lower abdomen yesterday,  Pain was more on the left but seems to be moving to the right.  History reviewed. No pertinent past medical history. History reviewed. No pertinent past surgical history. No family history on file. Social History  Substance Use Topics  . Smoking status: Never Smoker   . Smokeless tobacco: None  . Alcohol Use: Yes     Comment: occassionally   OB History    No data available     Review of Systems  Gastrointestinal: Positive for nausea, abdominal pain and anorexia. Negative for vomiting.  All other systems reviewed and are negative.     Allergies  Review of patient's allergies indicates no known allergies.  Home Medications   Prior to Admission medications   Not on File   BP 122/78 mmHg  Pulse 99  Temp(Src) 98.4 F (36.9 C) (Oral)  Resp 16  Ht 5\' 3"  (1.6 m)  Wt 115 lb (52.164 kg)  BMI 20.38 kg/m2  SpO2 100%  LMP  Physical Exam  Constitutional: She appears well-developed and well-nourished.  HENT:  Head:  Normocephalic and atraumatic.  Right Ear: External ear normal.  Left Ear: External ear normal.  Nose: Nose normal.  Mouth/Throat: Oropharynx is clear and moist.  Eyes: Conjunctivae and EOM are normal. Pupils are equal, round, and reactive to light.  Neck: Normal range of motion.  Cardiovascular: Normal rate and normal heart sounds.   Pulmonary/Chest: Effort normal and breath sounds normal.  Abdominal: Soft. There is tenderness.  Tender right lower quadrant  Musculoskeletal: Normal range of motion.  Neurological: She is alert.  Skin: Skin is warm.  Nursing note and vitals reviewed.   ED Course  Procedures (including critical care time) Labs Review Labs Reviewed  CBC  LIPASE, BLOOD  COMPREHENSIVE METABOLIC PANEL  URINALYSIS, ROUTINE W REFLEX MICROSCOPIC (NOT AT [facility])  I-STAT BETA HCG BLOOD, ED ([facility], [facility], [facility] ONLY)    Imaging Review No results found. I have personally reviewed and evaluated these images and lab results as part of my medical decision-making.   EKG Interpretation None      MDM Pt given IV fluids  Wbc's normal.   Ct scan shows appendicitis per Dr. Franchot GalloMansel.   Consult to surgery.   Dr. Andrey CampanileWilson will see here and evaluate   Final diagnoses:  Acute appendicitis, unspecified acute appendicitis type        Elson AreasLeslie K Sofia, PA-C 05/06/15 1641  Alvira MondayErin Schlossman, MD 05/06/15 2322

## 2015-05-06 NOTE — ED Notes (Signed)
Surgery MD at bedside.

## 2015-05-07 ENCOUNTER — Encounter (HOSPITAL_COMMUNITY): Payer: Self-pay | Admitting: General Practice

## 2015-05-07 LAB — BASIC METABOLIC PANEL
ANION GAP: 9 (ref 5–15)
BUN: 6 mg/dL (ref 6–20)
CALCIUM: 7.9 mg/dL — AB (ref 8.9–10.3)
CO2: 22 mmol/L (ref 22–32)
Chloride: 99 mmol/L — ABNORMAL LOW (ref 101–111)
Creatinine, Ser: 0.76 mg/dL (ref 0.44–1.00)
GFR calc Af Amer: >60 mL/min (ref >60)
GLUCOSE: 161 mg/dL — AB (ref 65–99)
POTASSIUM: 4.6 mmol/L (ref 3.5–5.1)
Sodium: 130 mmol/L — ABNORMAL LOW (ref 135–145)

## 2015-05-07 LAB — CBC
HEMATOCRIT: 34.4 % — AB (ref 36.0–46.0)
Hemoglobin: 11.5 g/dL — ABNORMAL LOW (ref 12.0–15.0)
MCH: 30.9 pg (ref 26.0–34.0)
MCHC: 33.4 g/dL (ref 30.0–36.0)
MCV: 92.5 fL (ref 78.0–100.0)
PLATELETS: 155 10*3/uL (ref 150–400)
RBC: 3.72 MIL/uL — ABNORMAL LOW (ref 3.87–5.11)
RDW: 12.3 % (ref 11.5–15.5)
WBC: 3.6 10*3/uL — ABNORMAL LOW (ref 4.0–10.5)

## 2015-05-07 MED ORDER — ONDANSETRON HCL 4 MG PO TABS: 4.0000 mg | ORAL_TABLET | Freq: Three times a day (TID) | ORAL | Status: DC | PRN

## 2015-05-07 MED ORDER — FLUCONAZOLE 100 MG PO TABS
200.0000 mg | ORAL_TABLET | Freq: Once | ORAL | Status: AC
  Administered 2015-05-07: 200 mg via ORAL
  Filled 2015-05-07: qty 2

## 2015-05-07 MED ORDER — OXYCODONE HCL 5 MG PO TABS: 5.0000 mg | ORAL_TABLET | Freq: Four times a day (QID) | ORAL | Status: DC | PRN

## 2015-05-07 NOTE — Discharge Summary (Signed)
[facility] Discharge Summary   Patient ID: Diane [name] MRN: [id number] DOB/AGE: 27-23-1987 27 y.o.  Admit date: 05/06/2015 Discharge date: 05/07/2015  Admitting Diagnosis: Acute appendicitis  Discharge Diagnosis Patient Active Problem List   Diagnosis Date Noted  . S/P laparoscopic appendectomy 05/06/2015    Consultants None  Imaging: Ct Abdomen Pelvis W Contrast  05/06/2015  CLINICAL DATA:  Initial encounter for right lower quadrant abdominal pain since last night. EXAM: CT ABDOMEN AND PELVIS WITH CONTRAST TECHNIQUE: Multidetector CT imaging of the abdomen and pelvis was performed using the standard protocol following bolus administration of intravenous contrast. CONTRAST:  OMNIPAQUE IOHEXOL 300 MG/ML  SOLN COMPARISON:  None. FINDINGS: Lower chest:  Unremarkable. Hepatobiliary: No focal abnormality within the liver parenchyma. There is no evidence for gallstones, gallbladder wall thickening, or pericholecystic fluid. No intrahepatic or extrahepatic biliary dilation. Pancreas: No focal mass lesion. No dilatation of the main duct. No intraparenchymal cyst. No peripancreatic edema. Spleen: No splenomegaly. No focal mass lesion. Adrenals/Urinary Tract: No adrenal nodule or mass. Kidneys are normal bilaterally. No evidence for hydroureter. The urinary bladder appears normal for the degree of distention. Stomach/Bowel: Stomach is nondistended. No gastric wall thickening. No evidence of outlet obstruction. Duodenum is normally positioned as is the ligament of Treitz. No small bowel wall thickening. No small bowel dilatation. Terminal ileum is not well seen. The appendix appears thickened, measuring up to 9 mm in diameter. Appendiceal wall is ill-defined and there appears to be slight hypovolemia in the wall of the appendix. Appendix is straightened and well seen on coronal images 23 and 24 of series 5. No gross colonic mass. No colonic wall thickening. No substantial  diverticular change. Vascular/Lymphatic: No abdominal aortic aneurysm. No abdominal atherosclerotic calcification. There is no gastrohepatic or hepatoduodenal ligament lymphadenopathy. No intraperitoneal or retroperitoneal lymphadenopy. No pelvic sidewall lymphadenopathy. Reproductive: Uterus is unremarkable.  There is no adnexal mass. Other: Trace free fluid is seen in the cul-de-sac. Musculoskeletal: Bone windows reveal no worrisome lytic or sclerotic osseous lesions. IMPRESSION: Mild thickening of the appendiceal diameter associated with a straightened configuration and ill definition of the wall. Together, these imaging features are suspicious for acute appendicitis. No evidence for perforation or abscess at this time. I discussed these findings by telephone with the Campbell Lerner , the PA caring for the patient, the as , slow at 1624 hours on 05/06/2015. Electronically Signed   By: Kennith Center M.D.   On: 05/06/2015 16:24    Procedures Dr. Janee Morn (05/06/15) - Laparoscopic Appendectomy  Hospital Course:  29 yo wf who developed periumbilical pain yesterday while traveling back from a wedding in [place] state. Pain has been constant and progressive. +nausea. This am pain became localized to RLQ and came to ED. Had some sweats last night. Driving to ED and hitting bumps, etc made pain worse. No prior symptoms. Has had a cough as well past several days - no fever, some productive sputum. No sore throat. Last bm yesterday. Normal urination. No discharge.  Just takes OCP and allergy med.  Workup showed acute appendicitis.  Patient was admitted and underwent procedure listed above.  Tolerated procedure well and was transferred to the floor.  Diet was advanced as tolerated.  On POD #1, the patient was voiding well, tolerating diet, ambulating well, pain well controlled, vital signs stable, incisions c/d/i and felt stable for discharge home.  Patient will follow up in our office in 2 weeks and knows to call with  questions or concerns.  Physical Exam: General:  Alert, NAD, pleasant, comfortable Abd:  Soft, mild distension, mild tenderness, incisions C/D/I, dermabond in place    Medication List    TAKE these medications        levocetirizine 5 MG tablet  Commonly known as:  XYZAL  Take 5 mg by mouth every evening.     montelukast 10 MG tablet  Commonly known as:  SINGULAIR  Take 10 mg by mouth at bedtime.     norgestimate-ethinyl estradiol 0.25-35 MG-MCG tablet  Commonly known as:  ORTHO-CYCLEN,SPRINTEC,PREVIFEM  Take 1 tablet by mouth daily.     ondansetron 4 MG tablet  Commonly known as:  ZOFRAN  Take 1 tablet (4 mg total) by mouth every 8 (eight) hours as needed for nausea.     oxyCODONE 5 MG immediate release tablet  Commonly known as:  Oxy IR/ROXICODONE  Take 1-2 tablets (5-10 mg total) by mouth every 6 (six) hours as needed for moderate pain.         Follow-up Information    [facility] Go on 05/22/2015.   Specialty:  General Surgery   Why:  For post-operation check.Your appointment is at 10:45, please arrive at least 30 min before your appointment to complete your check in paperwork.  If you are unable to arrive 30 min prior to your appointment time we may have to cancel or reschedule you.   Contact information:   [address] [place] [id number] [phone]      Signed: Bradd CanaryMegan N. Calynn Ferrero, PA-C [facility] [phone]  05/07/2015, 4:18 PM

## 2015-05-07 NOTE — Discharge Instructions (Signed)
Your appointment is at 10:45, please arrive at least 30 min before your appointment to complete your check in paperwork.  If you are unable to arrive 30 min prior to your appointment time we may have to cancel or reschedule you.  LAPAROSCOPIC SURGERY: POST OP INSTRUCTIONS  1. DIET: Follow a light bland diet the first 24 hours after arrival home, such as soup, liquids, crackers, etc. Be sure to include lots of fluids daily. Avoid fast food or heavy meals as your are more likely to get nauseated. Eat a low fat the next few days after surgery.  2. Take your usually prescribed home medications unless otherwise directed. 3. PAIN CONTROL:  1. Pain is best controlled by a usual combination of three different methods TOGETHER:  1. Ice/Heat 2. Over the counter pain medication 3. Prescription pain medication 2. Most patients will experience some swelling and bruising around the incisions. Ice packs or heating pads (30-60 minutes up to 6 times a day) will help. Use ice for the first few days to help decrease swelling and bruising, then switch to heat to help relax tight/sore spots and speed recovery. Some people prefer to use ice alone, heat alone, alternating between ice & heat. Experiment to what works for you. Swelling and bruising can take several weeks to resolve.  3. It is helpful to take an over-the-counter pain medication regularly for the first few weeks. Choose one of the following that works best for you:  1. Naproxen (Aleve, etc) Two 220mg  tabs twice a day 2. Ibuprofen (Advil, etc) Three 200mg  tabs four times a day (every meal & bedtime) 3. Acetaminophen (Tylenol, etc) 500-650mg  four times a day (every meal & bedtime) 4. A prescription for pain medication (such as oxycodone, hydrocodone, etc) should be given to you upon discharge. Take your pain medication as prescribed.  1. If you are having problems/concerns with the prescription medicine (does not control pain, nausea, vomiting, rash, itching,  etc), please call us [phone] to see if we need to switch you to a different pain medicine that will work better for you and/or control your side effect better. 2. If you need a refill on your pain medication, please contact your pharmacy. They will contact our office to request authorization. Prescriptions will not be filled after 5 pm or on week-ends. 4. Avoid getting constipated. Between the surgery and the pain medications, it is common to experience some constipation. Increasing fluid intake and taking a fiber supplement (such as Metamucil, Citrucel, FiberCon, MiraLax, etc) 1-2 times a day regularly will usually help prevent this problem from occurring. A mild laxative (prune juice, Milk of Magnesia, MiraLax, etc) should be taken according to package directions if there are no bowel movements after 48 hours.  5. Watch out for diarrhea. If you have many loose bowel movements, simplify your diet to bland foods & liquids for a few days. Stop any stool softeners and decrease your fiber supplement. Switching to mild anti-diarrheal medications (Kayopectate, Pepto Bismol) can help. If this worsens or does not improve, please call us. 6. Wash / shower every day. You may shower over the dressings as they are waterproof. Continue to shower over incision(s) after the dressing is off. 7. Remove your waterproof bandages 5 days after surgery. You may leave the incision open to air. You may replace a dressing/Band-Aid to cover the incision for comfort if you wish.  8. ACTIVITIES as tolerated:  1. You may resume regular (light) daily activities beginning the next day--such as  walking, climbing stairs--gradually increasing activities as tolerated. If you can walk 30 minutes without difficulty, it is safe to try more intense activity such as jogging, treadmill, bicycling, low-impact aerobics, swimming, etc. °2. Save the most intensive and strenuous activity for last such as sit-ups, heavy lifting,  contact sports, etc Refrain from any heavy lifting or straining until you are off narcotics for pain control.  °3. DO NOT PUSH THROUGH PAIN. Let pain be your guide: If it hurts to do something, don't do it. Pain is your body warning you to avoid that activity for another week until the pain goes down. °4. You may drive when you are no longer taking prescription pain medication, you can comfortably wear a seatbelt, and you can safely maneuver your car and apply brakes. °5. You may have sexual intercourse when it is comfortable.  °9. FOLLOW UP in our office  °1. Please call [facility] at [phone] to set up an appointment to see your surgeon in the office for a follow-up appointment approximately 2-3 weeks after your surgery. °2. Make sure that you call for this appointment the day you arrive home to insure a convenient appointment time. °     10. IF YOU HAVE DISABILITY OR FAMILY LEAVE FORMS, BRING THEM TO THE               OFFICE FOR PROCESSING.  ° °WHEN TO CALL US [phone]:  °1. Poor pain control °2. Reactions / problems with new medications (rash/itching, nausea, etc)  °3. Fever over 101.5 F (38.5 C) °4. Inability to urinate °5. Nausea and/or vomiting °6. Worsening swelling or bruising °7. Continued bleeding from incision. °8. Increased pain, redness, or drainage from the incision ° °The clinic staff is available to answer your questions during regular business hours (8:30am-5pm). Please don’t hesitate to call and ask to speak to one of our nurses for clinical concerns.  °If you have a medical emergency, go to the nearest emergency room or call 911.  °A surgeon from [facility] is always on call at the hospitals  ° °[facility]  °[address], [address], [place], [place] [address] ?  °MAIN: [phone] ? TOLL FREE: [phone] ?  °FAX [phone]  °[email or web address] ° °

## 2015-05-07 NOTE — Progress Notes (Signed)
Vira AgarJennifer Nordahl to be D/C'd home per MD order. Discussed with the patient and all questions fully answered.  VSS, Surgical sites clean, dry, intact with no sign of infection.  IV catheter discontinued intact. Site without signs and symptoms of complications. Dressing and pressure applied.  An After Visit Summary was printed and given to the patient. Patient received prescriptions. Patient able to tolerate solid food for lunch prior to discharge.  D/c education completed with patient/family including follow up instructions, medication list, d/c activities limitations if indicated, with other d/c instructions as indicated by MD - patient able to verbalize understanding, all questions fully answered.   Patient instructed to return to ED, call 911, or call MD for any changes in condition.   Patient to be escorted via WC, and D/C home via private auto.

## 2015-07-07 NOTE — L&D Delivery Note (Signed)
Operative Delivery Note At 11:53 AM a viable female was delivered via Vaginal, Vacuum Investment banker, operational(Extractor).  Presentation: vertex; Position: Right,, Occiput,, Posterior; Station: +3.  Verbal consent: obtained from patient.  Risks and benefits discussed in detail.  Risks include, but are not limited to the risks of anesthesia, bleeding, infection, damage to maternal tissues, fetal cephalhematoma.  There is also the risk of inability to effect vaginal delivery of the head, or shoulder dystocia that cannot be resolved by established maneuvers, leading to the need for emergency cesarean section. Indication : prolonged second stage and maternal exhaustion.  APGAR: 2, 5; weight 7 lb 3.7 oz (3280 g).   Placenta status: manual  ,intact .   Cord:  with the following complications: .  Cord pH: pending  Anesthesia:  epidural Instruments: Kiwi, 4 pulls, 2 pop offs, good progression. Episiotomy: Median Lacerations: Perineal;2nd degree Suture Repair: 2.0 vicryl Est. Blood Loss (mL): 400  Mom to postpartum.  Baby to Couplet care / Skin to Skin.  Buel Molder J 03/24/2016, 1:13 PM

## 2015-08-12 LAB — OB RESULTS CONSOLE RPR: RPR: NONREACTIVE

## 2015-08-12 LAB — OB RESULTS CONSOLE ANTIBODY SCREEN: ANTIBODY SCREEN: NEGATIVE

## 2015-08-12 LAB — OB RESULTS CONSOLE GC/CHLAMYDIA
Chlamydia: NEGATIVE
Gonorrhea: NEGATIVE

## 2015-08-12 LAB — OB RESULTS CONSOLE HEPATITIS B SURFACE ANTIGEN: Hepatitis B Surface Ag: NEGATIVE

## 2015-08-12 LAB — OB RESULTS CONSOLE ABO/RH: RH TYPE: POSITIVE

## 2015-08-12 LAB — OB RESULTS CONSOLE RUBELLA ANTIBODY, IGM: Rubella: IMMUNE

## 2015-08-12 LAB — OB RESULTS CONSOLE HIV ANTIBODY (ROUTINE TESTING): HIV: NONREACTIVE

## 2016-02-19 LAB — OB RESULTS CONSOLE GBS: STREP GROUP B AG: NEGATIVE

## 2016-03-24 ENCOUNTER — Encounter (HOSPITAL_COMMUNITY): Payer: Self-pay

## 2016-03-24 ENCOUNTER — Inpatient Hospital Stay (HOSPITAL_COMMUNITY): Payer: 59 | Admitting: Anesthesiology

## 2016-03-24 ENCOUNTER — Inpatient Hospital Stay (HOSPITAL_COMMUNITY)
Admission: AD | Admit: 2016-03-24 | Discharge: 2016-03-26 | DRG: 775 | Disposition: A | Discharge status: Home / Self Care | Payer: 59 | Source: Ambulatory Visit | Attending: Obstetrics and Gynecology | Admitting: Obstetrics and Gynecology

## 2016-03-24 DIAGNOSIS — O480 Post-term pregnancy: Secondary | ICD-10-CM | POA: Diagnosis present

## 2016-03-24 DIAGNOSIS — O324XX0 Maternal care for high head at term, not applicable or unspecified: Secondary | ICD-10-CM | POA: Diagnosis present

## 2016-03-24 DIAGNOSIS — O9081 Anemia of the puerperium: Secondary | ICD-10-CM | POA: Diagnosis not present

## 2016-03-24 DIAGNOSIS — O2243 Hemorrhoids in pregnancy, third trimester: Secondary | ICD-10-CM | POA: Diagnosis present

## 2016-03-24 DIAGNOSIS — Z3A41 41 weeks gestation of pregnancy: Secondary | ICD-10-CM | POA: Diagnosis not present

## 2016-03-24 DIAGNOSIS — Z8759 Personal history of other complications of pregnancy, childbirth and the puerperium: Secondary | ICD-10-CM

## 2016-03-24 DIAGNOSIS — D62 Acute posthemorrhagic anemia: Secondary | ICD-10-CM | POA: Diagnosis not present

## 2016-03-24 HISTORY — DX: Unspecified abnormal cytological findings in specimens from vagina: R87.629

## 2016-03-24 LAB — CBC
HCT: 35.8 % — ABNORMAL LOW (ref 36.0–46.0)
Hemoglobin: 12.7 g/dL (ref 12.0–15.0)
MCH: 31.6 pg (ref 26.0–34.0)
MCHC: 35.5 g/dL (ref 30.0–36.0)
MCV: 89.1 fL (ref 78.0–100.0)
PLATELETS: 213 10*3/uL (ref 150–400)
RBC: 4.02 MIL/uL (ref 3.87–5.11)
RDW: 13.2 % (ref 11.5–15.5)
WBC: 16.9 10*3/uL — AB (ref 4.0–10.5)

## 2016-03-24 LAB — TYPE AND SCREEN
ABO/RH(D): O POS
Antibody Screen: NEGATIVE

## 2016-03-24 LAB — ABO/RH: ABO/RH(D): O POS

## 2016-03-24 LAB — RPR: RPR: NONREACTIVE

## 2016-03-24 MED ORDER — INFLUENZA VAC SPLIT QUAD 0.5 ML IM SUSY
0.5000 mL | PREFILLED_SYRINGE | INTRAMUSCULAR | Status: AC
  Administered 2016-03-26: 0.5 mL via INTRAMUSCULAR

## 2016-03-24 MED ORDER — SOD CITRATE-CITRIC ACID 500-334 MG/5ML PO SOLN
30.0000 mL | ORAL | Status: DC | PRN
  Filled 2016-03-24: qty 15

## 2016-03-24 MED ORDER — LIDOCAINE HCL (PF) 1 % IJ SOLN
INTRAMUSCULAR | Status: DC | PRN
  Administered 2016-03-24 (×2): 4 mL

## 2016-03-24 MED ORDER — TETANUS-DIPHTH-ACELL PERTUSSIS 5-2.5-18.5 LF-MCG/0.5 IM SUSP
0.5000 mL | Freq: Once | INTRAMUSCULAR | Status: DC
  Filled 2016-03-24: qty 0.5

## 2016-03-24 MED ORDER — LACTATED RINGERS IV SOLN
500.0000 mL | INTRAVENOUS | Status: DC | PRN
Start: 2016-03-24 — End: 2016-03-25
  Administered 2016-03-24: 1000 mL via INTRAVENOUS

## 2016-03-24 MED ORDER — PHENYLEPHRINE 40 MCG/ML (10ML) SYRINGE FOR IV PUSH (FOR BLOOD PRESSURE SUPPORT)
80.0000 ug | PREFILLED_SYRINGE | INTRAVENOUS | Status: DC | PRN
  Filled 2016-03-24: qty 5

## 2016-03-24 MED ORDER — HYDROCORTISONE 2.5 % RE CREA
TOPICAL_CREAM | Freq: Three times a day (TID) | RECTAL | Status: DC
  Administered 2016-03-24 – 2016-03-25 (×2): via RECTAL
  Filled 2016-03-24: qty 28.35

## 2016-03-24 MED ORDER — ACETAMINOPHEN 325 MG PO TABS: 650.0000 mg | ORAL_TABLET | ORAL | Status: DC | PRN

## 2016-03-24 MED ORDER — EPHEDRINE 5 MG/ML INJ
10.0000 mg | INTRAVENOUS | Status: DC | PRN
  Filled 2016-03-24: qty 4

## 2016-03-24 MED ORDER — SENNOSIDES-DOCUSATE SODIUM 8.6-50 MG PO TABS
2.0000 | ORAL_TABLET | ORAL | Status: DC
  Administered 2016-03-25 (×2): 2 via ORAL
  Filled 2016-03-24 (×2): qty 2

## 2016-03-24 MED ORDER — LIDOCAINE HCL (PF) 1 % IJ SOLN
30.0000 mL | INTRAMUSCULAR | Status: DC | PRN
  Filled 2016-03-24: qty 30

## 2016-03-24 MED ORDER — FENTANYL 2.5 MCG/ML BUPIVACAINE 1/10 % EPIDURAL INFUSION (WH - ANES)
14.0000 mL/h | INTRAMUSCULAR | Status: DC | PRN
  Administered 2016-03-24 (×2): 14 mL/h via EPIDURAL
  Filled 2016-03-24: qty 125

## 2016-03-24 MED ORDER — BISACODYL 10 MG RE SUPP
10.0000 mg | Freq: Every day | RECTAL | Status: DC | PRN
  Filled 2016-03-24: qty 1

## 2016-03-24 MED ORDER — LACTATED RINGERS IV SOLN: 500.0000 mL | Freq: Once | INTRAVENOUS | Status: DC

## 2016-03-24 MED ORDER — ACETAMINOPHEN 325 MG PO TABS
650.0000 mg | ORAL_TABLET | ORAL | Status: DC | PRN
  Administered 2016-03-25: 650 mg via ORAL
  Filled 2016-03-24: qty 2

## 2016-03-24 MED ORDER — DIBUCAINE 1 % RE OINT
1.0000 "application " | TOPICAL_OINTMENT | RECTAL | Status: DC | PRN
  Filled 2016-03-24: qty 28.4

## 2016-03-24 MED ORDER — TERBUTALINE SULFATE 1 MG/ML IJ SOLN: 0.2500 mg | Freq: Once | INTRAMUSCULAR | Status: DC | PRN

## 2016-03-24 MED ORDER — BENZOCAINE-MENTHOL 20-0.5 % EX AERO
1.0000 "application " | INHALATION_SPRAY | CUTANEOUS | Status: DC | PRN
  Administered 2016-03-24: 1 via TOPICAL
  Filled 2016-03-24 (×2): qty 56

## 2016-03-24 MED ORDER — FLEET ENEMA 7-19 GM/118ML RE ENEM: 1.0000 | ENEMA | Freq: Every day | RECTAL | Status: DC | PRN

## 2016-03-24 MED ORDER — PRENATAL MULTIVITAMIN CH
1.0000 | ORAL_TABLET | Freq: Every day | ORAL | Status: DC
  Administered 2016-03-24 – 2016-03-25 (×2): 1 via ORAL
  Filled 2016-03-24 (×2): qty 1

## 2016-03-24 MED ORDER — OXYTOCIN 10 UNIT/ML IJ SOLN
10.0000 [IU] | Freq: Once | INTRAMUSCULAR | Status: DC
Start: 2016-03-24 — End: 2016-03-25

## 2016-03-24 MED ORDER — ONDANSETRON HCL 4 MG/2ML IJ SOLN
4.0000 mg | Freq: Four times a day (QID) | INTRAMUSCULAR | Status: DC | PRN
  Administered 2016-03-24: 4 mg via INTRAVENOUS
  Filled 2016-03-24: qty 2

## 2016-03-24 MED ORDER — OXYCODONE-ACETAMINOPHEN 5-325 MG PO TABS: 1.0000 | ORAL_TABLET | ORAL | Status: DC | PRN

## 2016-03-24 MED ORDER — PHENYLEPHRINE 40 MCG/ML (10ML) SYRINGE FOR IV PUSH (FOR BLOOD PRESSURE SUPPORT)
80.0000 ug | PREFILLED_SYRINGE | INTRAVENOUS | Status: DC | PRN
  Administered 2016-03-24 (×2): 80 ug via INTRAVENOUS
  Filled 2016-03-24: qty 5

## 2016-03-24 MED ORDER — FENTANYL 2.5 MCG/ML BUPIVACAINE 1/10 % EPIDURAL INFUSION (WH - ANES)
INTRAMUSCULAR | Status: AC
  Filled 2016-03-24: qty 125

## 2016-03-24 MED ORDER — IBUPROFEN 600 MG PO TABS
600.0000 mg | ORAL_TABLET | Freq: Four times a day (QID) | ORAL | Status: DC
  Administered 2016-03-24 – 2016-03-26 (×8): 600 mg via ORAL
  Filled 2016-03-24 (×9): qty 1

## 2016-03-24 MED ORDER — LIDOCAINE HCL (PF) 1 % IJ SOLN
INTRAMUSCULAR | Status: AC
  Filled 2016-03-24: qty 30

## 2016-03-24 MED ORDER — OXYTOCIN BOLUS FROM INFUSION: 500.0000 mL | Freq: Once | INTRAVENOUS | Status: DC

## 2016-03-24 MED ORDER — OXYCODONE-ACETAMINOPHEN 5-325 MG PO TABS: 2.0000 | ORAL_TABLET | ORAL | Status: DC | PRN

## 2016-03-24 MED ORDER — DIPHENHYDRAMINE HCL 25 MG PO CAPS: 25.0000 mg | ORAL_CAPSULE | Freq: Four times a day (QID) | ORAL | Status: DC | PRN

## 2016-03-24 MED ORDER — OXYTOCIN 40 UNITS IN LACTATED RINGERS INFUSION - SIMPLE MED
INTRAVENOUS | Status: AC
  Filled 2016-03-24: qty 1000

## 2016-03-24 MED ORDER — PHENYLEPHRINE 40 MCG/ML (10ML) SYRINGE FOR IV PUSH (FOR BLOOD PRESSURE SUPPORT)
PREFILLED_SYRINGE | INTRAVENOUS | Status: AC
  Filled 2016-03-24: qty 20

## 2016-03-24 MED ORDER — OXYTOCIN 40 UNITS IN LACTATED RINGERS INFUSION - SIMPLE MED
2.5000 [IU]/h | INTRAVENOUS | Status: DC
Start: 2016-03-24 — End: 2016-03-25
  Administered 2016-03-24: 39.96 [IU]/h via INTRAVENOUS

## 2016-03-24 MED ORDER — SIMETHICONE 80 MG PO CHEW: 80.0000 mg | CHEWABLE_TABLET | ORAL | Status: DC | PRN

## 2016-03-24 MED ORDER — ONDANSETRON HCL 4 MG PO TABS: 4.0000 mg | ORAL_TABLET | ORAL | Status: DC | PRN

## 2016-03-24 MED ORDER — WITCH HAZEL-GLYCERIN EX PADS: 1.0000 "application " | MEDICATED_PAD | CUTANEOUS | Status: DC | PRN

## 2016-03-24 MED ORDER — COCONUT OIL OIL
1.0000 "application " | TOPICAL_OIL | Status: DC | PRN
  Filled 2016-03-24 (×2): qty 120

## 2016-03-24 MED ORDER — ONDANSETRON HCL 4 MG/2ML IJ SOLN: 4.0000 mg | INTRAMUSCULAR | Status: DC | PRN

## 2016-03-24 MED ORDER — HYDROCORTISONE ACE-PRAMOXINE 2.5-1 % RE CREA: TOPICAL_CREAM | Freq: Three times a day (TID) | RECTAL | Status: DC

## 2016-03-24 MED ORDER — TERBUTALINE SULFATE 1 MG/ML IJ SOLN
0.2500 mg | Freq: Once | INTRAMUSCULAR | Status: AC | PRN
  Administered 2016-03-24: 0.25 mg via SUBCUTANEOUS
  Filled 2016-03-24: qty 1

## 2016-03-24 MED ORDER — OXYTOCIN 40 UNITS IN LACTATED RINGERS INFUSION - SIMPLE MED
1.0000 m[IU]/min | INTRAVENOUS | Status: DC
  Administered 2016-03-24: 1.333 m[IU]/min via INTRAVENOUS

## 2016-03-24 MED ORDER — DIPHENHYDRAMINE HCL 50 MG/ML IJ SOLN
12.5000 mg | INTRAMUSCULAR | Status: DC | PRN
Start: 2016-03-24 — End: 2016-03-26

## 2016-03-24 NOTE — Progress Notes (Addendum)
S: Pushing actively with guidance for past 3 hours, used various position and techniques, side lying, tug-of-war, used visualization. Comfortable w/ epidural but increasing urge to bear down.   O:  Vitals:   03/24/16 0630 03/24/16 0730 03/24/16 0830 03/24/16 0930  BP: (!) 92/46     Pulse: 80     Resp: 18 15 18 16   Temp:  99 F (37.2 C)    TempSrc:  Oral    SpO2:      Weight:      Height:        FHR 150, mod var, + accels, + variable decels, increasing in frequency Ctx q 2 min, palp strong  GU : +2 station, + caput and minimal molding  Pitocin at 6 mu/min  A/P: G1 at 41 wks Prolonged 2nd stage, arrest of descent Augmentation of labor with Pitocin FHT category 1  Options reviewed w/ patient -  1)  continue pushing but not likely to deliver soon and maternal and fetal fatigue setting in. 2) Cesarean section 3) Vacuum assist birth by Dr. Billy Coastaavon, Risks and benefits discussed in detail. Risks include, but are not limited to the risks of bleeding, infection, damage to maternal tissues, fetal cephalhematoma. There is also the risk of inability to effect vaginal delivery of the head, or shoulder dystocia that cannot be resolved by established maneuvers, leading to the need for emergency cesarean section. Patient agreed. Decision made to proceed with Vacuum assisted vaginal delivery. Verbal consent obtained.  Dr. Billy Coastaavon updated, will come assess patient.

## 2016-03-24 NOTE — Progress Notes (Signed)
Colon Flattery Paul CNM remains at bedside during second stage

## 2016-03-24 NOTE — H&P (Signed)
OB ADMISSION/ HISTORY & PHYSICAL:  Admission Date: 03/24/2016  2:42 AM  Admit Diagnosis: Protracted active stage of labor, 9.5 cm, OP presentation  Diane [name] is a 30 y.o. female presenting for active labor, protracted, at 9.5 cm for past 2.5 hrs. [facility]/pain management..  Prenatal History: G1P0   EDC : 03/17/16 Prenatal care at [facility], then [facility], then [facility] starting at 31 wks Prenatal course uncomplicated Normal AFI 03/23/16 - 14 cm [facility] 7 lbs per Leopold's Anatomy scan 11/13/15, 22 wks, normal female anatomy, anterior placenta  Prenatal Labs: ABO, Rh: O (02/06 0000)  Antibody: Negative (02/06 0000) Rubella: Immune (02/06 0000)  RPR: Nonreactive (02/06 0000)  HBsAg: Negative (02/06 0000)  HIV: Non-reactive (02/06 0000)  GBS: Negative (08/16 0000)  1 hr Glucola : 80   Medical / Surgical History :  Past medical history:  Past Medical History:  Diagnosis Date  . History of UTI      Past surgical history:  Past Surgical History:  Procedure Laterality Date  . APPENDECTOMY  05/06/2015   laproscopic  . LAPAROSCOPIC APPENDECTOMY N/A 05/06/2015   Procedure: APPENDECTOMY LAPAROSCOPIC;  Surgeon: Violeta GelinasBurke Thompson, MD;  [facility];  Service: General;  Laterality: N/A;     Family History: No family history on file.   Social History:  reports that she has never smoked. She has never used smokeless tobacco. She reports that she drinks alcohol. She reports that she does not use drugs.   Allergies: Review of patient's allergies indicates no known allergies.    Current Medications at time of admission:  Prescriptions Prior to Admission  Medication Sig Dispense Refill Last Dose  . levocetirizine (XYZAL) 5 MG tablet Take 5 mg by mouth every evening.   05/03/2015 at Unknown time  . montelukast (SINGULAIR) 10 MG tablet Take 10 mg by mouth at bedtime.   05/03/2015 at Unknown time  . ondansetron (ZOFRAN) 4 MG tablet Take 1  tablet (4 mg total) by mouth every 8 (eight) hours as needed for nausea. 5 tablet 0       Review of Systems: ROS  Painful ctx, urge to push   Physical Exam:   VSSAF prior [facility] from [facility] General: AAO x 3, NAD Heart: RRR Lungs:CTAB Abdomen: gravid, NT Extremities:no edema Genitalia / VE: 9.5/90/0, anterior lip FHR: 130, mod var, + accels,no decels TOCO: Q203 min, strong  Labs:    Pending   Assessment:  30 y.o. G1P0 at 41 wks  1. Labor: protracted active stage 2/2 OP presentation 2. Fetal Wellbeing: Category 1  3. Pain Control: plan epidural 4. GBS: negative  Plan:  1. Admit to BS 2. Routine L&D orders 3. Epidural now 4. Foley cath, rotate positions / peanut ball to facilitate internal rotation    Consultant: Dr. Billy Coastaavon updated and agrees w. POC    Neta Mendsaniela C Breck Hollinger, CNM, MSN 03/24/2016, 3:05 AM

## 2016-03-24 NOTE — Lactation Note (Signed)
This note was copied from a baby's chart. Lactation Consultation Note  Initial visit at 7 hours of age.  Mom had a prolonged labor with pushing for several hours and vacuume extraction with code apgar. Baby has not had a feeding yet and mom reports baby is not showing rooting feeding cues.  Baby is STS with mom in prone position with mom laid back.  Mom has small nipples with firm breasts semi compressible.  Baby will open mouth but is not sucking to latch at breast.  Baby is fussy and pushing away from breast and sleepy, not showing eagerness to eat.  Baby remains with mom STS and asleep.  [facility] LC resources given and discussed.  Encouraged to feed with early cues on demand. Mom will continue to offer STS every few hours if baby is still not showing feeding cues.  Early newborn behavior discussed.  Hand expression demonstrated by mom with colostrum visible.  Mom to call for assist as needed.   Patient Name: Diane [name]: 03/24/2016 Reason for consult: Initial assessment   Maternal Data Has patient been taught Hand Expression?: Yes Does the patient have breastfeeding experience prior to this delivery?: No  Feeding Feeding Type: Breast Fed Length of feed: 5 min  LATCH Score/Interventions Latch: Repeated attempts needed to sustain latch, nipple held in mouth throughout feeding, stimulation needed to elicit sucking reflex. Intervention(s): Skin to skin;Teach feeding cues;Waking techniques Intervention(s): Adjust position;Assist with latch;Breast compression  Audible Swallowing: A few with stimulation Intervention(s): Skin to skin;Hand expression Intervention(s): Skin to skin;Hand expression  Type of Nipple: Everted at rest and after stimulation  Comfort (Breast/Nipple): Soft / non-tender     Hold (Positioning): Assistance needed to correctly position infant at breast and maintain latch. Intervention(s): Breastfeeding basics reviewed;Support Pillows;Position options;Skin to  skin  LATCH Score: 7  Lactation Tools Discussed/Used     Consult Status Consult Status: Follow-up [name]: 03/25/16 Follow-up type: In-patient    Willine Schwalbe, Arvella MerlesJana Lynn 03/24/2016, 7:22 PM

## 2016-03-24 NOTE — Progress Notes (Signed)
Patient did not have a hypotensive episode post epidural placement. She was at her baseline blood pressure.

## 2016-03-24 NOTE — Anesthesia Rounding Note (Signed)
  [facility] Epidural Rounding Note  Patient: Diane AgarJennifer Kaminsky, 30 y.o., female  Patient's current pain level:   0 Agreed upon pain management level: per Dr. Karie SchwalbeMary Judd, do not go in to see patient for anesthesia OB rounding. Let her rest.  Epidural intervention: Epidural placed and patient comfortable, no pain per Dr. Karie SchwalbeMary Judd  Comments:   [facility],[name] 03/24/2016

## 2016-03-24 NOTE — Anesthesia Pain Management Evaluation Note (Signed)
  [facility] Pain Management Visit Note  Patient: Diane AgarJennifer [name], 30 y.o., female  "Hello I am a member of the anesthesia team at [facility]. We have an anesthesia team available at all times to provide care throughout the hospital, including epidural management and anesthesia for C-section. I don't know your plan for the delivery whether it a natural birth, water birth, IV sedation, nitrous supplementation, doula or epidural, but we want to meet your pain goals."   1.Was your pain managed to your expectations on prior hospitalizations?   No prior hospitalizations  2.What is your expectation for pain management during this hospitalization?     Epidural  3.How can we help you reach that goal? epidural  Record the patient's initial score and the patient's pain goal.   Pain: 1  Pain Goal: 8 [facility] wants you to be able to say your pain was always managed very well.  Diane [name] 03/24/2016

## 2016-03-24 NOTE — Progress Notes (Signed)
Colon Flattery Paul CNM at bedside to discuss POC--keep pushing, vacuum, C/S.  Answered questions for family and doula.

## 2016-03-24 NOTE — Anesthesia Procedure Notes (Signed)
Epidural Patient location during procedure: OB  Staffing Anesthesiologist: Tymir Terral Performed: anesthesiologist   Preanesthetic Checklist Completed: patient identified, site marked, surgical consent, pre-op evaluation, timeout performed, IV checked, risks and benefits discussed and monitors and equipment checked  Epidural Patient position: sitting Prep: site prepped and draped and DuraPrep Patient monitoring: continuous pulse ox and blood pressure Approach: midline Location: L3-L4 Injection technique: LOR saline  Needle:  Needle type: Tuohy  Needle gauge: 17 G Needle length: 9 cm and 9 Needle insertion depth: 5 cm cm Catheter type: closed end flexible Catheter size: 19 Gauge Catheter at skin depth: 10 cm Test dose: negative  Assessment Events: blood not aspirated, injection not painful, no injection resistance, negative IV test and no paresthesia  Additional Notes Patient identified. Risks/Benefits/Options discussed with patient including but not limited to bleeding, infection, nerve damage, paralysis, failed block, incomplete pain control, headache, blood pressure changes, nausea, vomiting, reactions to medication both or allergic, itching and postpartum back pain. Confirmed with bedside nurse the patient's most recent platelet count. Confirmed with patient that they are not currently taking any anticoagulation, have any bleeding history or any family history of bleeding disorders. Patient expressed understanding and wished to proceed. All questions were answered. Sterile technique was used throughout the entire procedure. Please see nursing notes for vital signs. Test dose was given through epidural catheter and negative prior to continuing to dose epidural or start infusion. Warning signs of high block given to the patient including shortness of breath, tingling/numbness in hands, complete motor block, or any concerning symptoms with instructions to call for help. Patient was  given instructions on fall risk and not to get out of bed. All questions and concerns addressed with instructions to call with any issues or inadequate analgesia.        

## 2016-03-24 NOTE — Progress Notes (Signed)
S: Comfortable, napping. Pitocin not started yet due to unit acuity O:  Vitals:   03/24/16 0520 03/24/16 0530  BP: (!) 108/55 (!) 99/53  Pulse: (!) 103 (!) 107  Resp: 18 18   FHR 140, mod var, + accels, variable decels Ctx q3-5 min, mod to palp  Cvx 10/+1, ROP Pushed with several ctx, minimal descent noted  A/P: OP presentation Suboptimal ctx strength FHT category 1   Start Pitocin augmentation Passive descent Cautious approach to vaginal birth

## 2016-03-24 NOTE — Anesthesia Preprocedure Evaluation (Signed)
Anesthesia Evaluation  Patient identified by MRN, date of birth, ID band Patient awake    Reviewed: Allergy & Precautions, NPO status , Patient's Chart, lab work & pertinent test results  History of Anesthesia Complications Negative for: history of anesthetic complications  Airway Mallampati: II  TM Distance: >3 FB Neck ROM: Full    Dental no notable dental hx. (+) Dental Advisory Given   Pulmonary neg pulmonary ROS,    Pulmonary exam normal breath sounds clear to auscultation       Cardiovascular negative cardio ROS Normal cardiovascular exam Rhythm:Regular Rate:Normal     Neuro/Psych negative neurological ROS  negative psych ROS   GI/Hepatic negative GI ROS, Neg liver ROS,   Endo/Other  negative endocrine ROS  Renal/GU negative Renal ROS  negative genitourinary   Musculoskeletal negative musculoskeletal ROS (+)   Abdominal   Peds negative pediatric ROS (+)  Hematology negative hematology ROS (+)   Anesthesia Other Findings   Reproductive/Obstetrics (+) Pregnancy                             Anesthesia Physical Anesthesia Plan  ASA: II  Anesthesia Plan: Epidural   Post-op Pain Management:    Induction:   Airway Management Planned:   Additional Equipment:   Intra-op Plan:   Post-operative Plan:   Informed Consent: I have reviewed the patients History and Physical, chart, labs and discussed the procedure including the risks, benefits and alternatives for the proposed anesthesia with the patient or authorized representative who has indicated his/her understanding and acceptance.   Dental advisory given  Plan Discussed with: CRNA  Anesthesia Plan Comments:         Anesthesia Quick Evaluation  

## 2016-03-24 NOTE — Anesthesia Postprocedure Evaluation (Signed)
Anesthesia Post Note  Patient: Diane AgarJennifer [name]  Procedure(s) Performed: * No procedures listed *  Patient location during evaluation: Mother Baby Anesthesia Type: Epidural Level of consciousness: awake and alert Pain management: pain level controlled Vital Signs Assessment: post-procedure vital signs reviewed and stable Respiratory status: spontaneous breathing and nonlabored ventilation Cardiovascular status: stable Postop Assessment: no headache, patient able to bend at knees, no backache, no signs of nausea or vomiting, epidural receding and adequate PO intake Anesthetic complications: no     Last Vitals:  Vitals:   03/24/16 1400 03/24/16 1500  BP: (!) 96/56 (!) 99/55  Pulse: 73 76  Resp: 16 16  Temp: 37.2 C 37 C    Last Pain:  Vitals:   03/24/16 1500  TempSrc: Axillary  PainSc: 1    Pain Goal: Patients Stated Pain Goal: 4 (03/24/16 1500)               Toma Arts Hristova

## 2016-03-24 NOTE — Progress Notes (Signed)
S: Doing well, pain controlled with  Epidural, occasional pelvic pressure.    O: Vitals:   03/24/16 0339 03/24/16 0340 03/24/16 0344 03/24/16 0345  BP: 103/62 106/60  (!) 102/52  Pulse: 74 73 73 95  Resp: 18 17    Weight:      Height:         FHT: 130, mod variability, + accels, prolonged decel x 5 min to 90 BPM after epidural placement, hypotensive --> resolved w/ IUR and ephedrine, one dose Terbutaline SQ given  UC:   regular, every 3-7 minutes SVE:    c/c/+1 ROT, clear AF   A / P: Spontaneous labor, progressing normally  Will start Pitocin augmentation after 30 min if FHT maintains category 1 Anterior lip resolved, will allow for passive descent 1-2 hrs, resume active 2nd stage Fetal Wellbeing:  Category I at present, was category 2 with hypotensive episode post epidural Pain Control:  Epidural effective GBS negative  Anticipated MOD:  NSVD  Diane [name], CNM, MSN 03/24/2016, 3:51 AM

## 2016-03-25 DIAGNOSIS — D62 Acute posthemorrhagic anemia: Secondary | ICD-10-CM | POA: Diagnosis not present

## 2016-03-25 LAB — CBC
HCT: 25.7 % — ABNORMAL LOW (ref 36.0–46.0)
Hemoglobin: 8.9 g/dL — ABNORMAL LOW (ref 12.0–15.0)
MCH: 32 pg (ref 26.0–34.0)
MCHC: 34.6 g/dL (ref 30.0–36.0)
MCV: 92.4 fL (ref 78.0–100.0)
PLATELETS: 164 10*3/uL (ref 150–400)
RBC: 2.78 MIL/uL — ABNORMAL LOW (ref 3.87–5.11)
RDW: 13.7 % (ref 11.5–15.5)
WBC: 12.4 10*3/uL — AB (ref 4.0–10.5)

## 2016-03-25 MED ORDER — POLYSACCHARIDE IRON COMPLEX 150 MG PO CAPS
150.0000 mg | ORAL_CAPSULE | Freq: Two times a day (BID) | ORAL | Status: DC
  Administered 2016-03-25 – 2016-03-26 (×3): 150 mg via ORAL
  Filled 2016-03-25 (×3): qty 1

## 2016-03-25 MED ORDER — OXYCODONE-ACETAMINOPHEN 5-325 MG PO TABS
1.0000 | ORAL_TABLET | Freq: Four times a day (QID) | ORAL | Status: DC | PRN
  Administered 2016-03-25 – 2016-03-26 (×4): 1 via ORAL
  Filled 2016-03-25 (×4): qty 1

## 2016-03-25 NOTE — Lactation Note (Addendum)
This note was copied from a baby's chart. Lactation Consultation Note  Patient Name: Diane Vira AgarJennifer Wigger WUJWJ'XToday's Date: 03/25/2016 Reason for consult: Follow-up assessment   [facility].  Finishing feeding.  Baby very sleepy.  Mom encouraged to hand express.  Demonstrated how to finger feed colostrum back to baby via curved tip syringe.  Baby went back to breast after 5 ml of EBM.  Changed nipple shield to size 20 for better fit.  Encouraged mom to breastfeed with cues and wake baby as needed to eat. LC discussed with mom that baby should be going to breast and or eating 8-12 times a day.   RN to set up DEBP for mom to postpump.  Encouraged to hand express after pumping and to give back to baby any colostrum received.  Mom to call for assistance with latch.      Maternal Data    Feeding Feeding Type: Breast Fed Length of feed: 20 min  LATCH Score/Interventions Latch: Repeated attempts needed to sustain latch, nipple held in mouth throughout feeding, stimulation needed to elicit sucking reflex. (nipple shield changed to size 20 from 16) Intervention(s): Skin to skin;Waking techniques Intervention(s): Adjust position;Assist with latch;Breast massage;Breast compression  Audible Swallowing: A few with stimulation Intervention(s): Hand expression Intervention(s): Hand expression;Skin to skin  Type of Nipple: Everted at rest and after stimulation (short shaft)  Comfort (Breast/Nipple): Soft / non-tender     Hold (Positioning): Assistance needed to correctly position infant at breast and maintain latch. Intervention(s): Breastfeeding basics reviewed;Support Pillows;Position options;Skin to skin  LATCH Score: 7  Lactation Tools Discussed/Used Tools: Pump Nipple shield size: 16;20 Breast pump type: Manual   Consult Status Consult Status: Follow-up Date: 03/26/16 Follow-up type: In-patient    Alfred LevinsGranger, Labib Cwynar Ann 03/25/2016, 2:52 PM

## 2016-03-25 NOTE — Progress Notes (Signed)
PPD # 1 VAVD Information for the patient's newborn:  Lucita LoraBiggs, Boy Ethelyn [[id number]][[id number]]  female    breast feeding, sleepy baby, slow to suckle, [facility]  / Circumcision planned, would defer until tomorrow d/t baby latching impaired Baby name: Tripp  S:  Reports feeling sore, hard to walk, but in good spirits             Tolerating po/ No nausea or vomiting             Bleeding is light             Pain moderately controlled with ibuprofen             Up ad lib / ambulatory / voiding without difficulties    No pain from hemorrhoids, steroid cream helping     O:  A & O x 3, in no apparent distress              VS:  Vitals:   03/24/16 1500 03/24/16 1833 03/25/16 0037 03/25/16 0558  BP: (!) 99/55 (!) 92/54 (!) 91/50 (!) 89/51  Pulse: 76 78 82 73  Resp: 16 16 18 16   Temp: 98.6 F (37 C) 98.4 F (36.9 C) 98.2 F (36.8 C) 98.1 F (36.7 C)  TempSrc: Axillary Axillary Axillary Oral  SpO2:      Weight:      Height:        LABS:  Recent Labs  03/24/16 0255 03/25/16 0607  WBC 16.9* 12.4*  HGB 12.7 8.9*  HCT 35.8* 25.7*  PLT 213 164    Blood type: --/--/O POS, O POS (09/19 0255)  Rubella: Immune (02/06 0000)   I&O: I/O last 3 completed shifts: In: -  Out: 2450 [Urine:2050; Blood:400]          No intake/output data recorded.  Lungs: Clear and unlabored  Heart: regular rate and rhythm / no murmurs  Abdomen: soft, non-tender, non-distended             Fundus: firm, non-tender, U-2  Perineum: moderate edema, (+) hemorrhoids, no induration, soft  Lochia: moderate, no clots  Extremities: no edema, no calf pain or tenderness    A/P: PPD # 1 30 y.o., G1P1001   Principal Problem:   Status post vacuum-assisted vaginal delivery (9/19) Active Problems:   Postpartum care following vaginal delivery   Acute blood loss anemia   Doing well - stable status  Continue LC support for breastfeeding  Oral Fe started  Add Percocet 1 tab PRN for pain  Routine post partum  orders  Anticipate discharge tomorrow    Neta Mendsaniela C Paul, MSN, CNM 03/25/2016, 1:01 PM

## 2016-03-25 NOTE — Lactation Note (Signed)
This note was copied from a baby's chart. Lactation Consultation Note  Patient Name: Diane Vira AgarJennifer Steven ZOXWR'UToday's Date: 03/25/2016 Reason for consult: Follow-up assessment Baby very sleepy this am. Mom reports baby not sustaining latch at breast. She is wearing her breast shells, good amount of colostrum present with hand expression. Mom pushed for several hours during delivery, baby was VAVD and has bruising. Baby fussy with positioning. Per chart review has been to breast 5 times for 2-10 minutes, no void yet, 5 stools, baby now 3422 hour old. Had Mom pre-pump to help with latch due to short nipple shafts but baby unable to obtain good depth with trying several positions. Baby is noted to have short posterior lingual frenulum. Tight mouth with suck exam. Demonstrated jaw massage and suck training to parents. Tried #16 nipple shield and baby could sustain latch but fell asleep after few minutes at breast. Mom hand expressed and received approx 3 ml of colostrum, demonstrated spoon feeding this back to baby. Left baby STS on Mom and left LC phone number for Mom to call with next feeding for assist. Mom would like to go home today. Advised parents Baby needs to be waking and starting to show some interest in BF. Mom to call.   Maternal Data Has patient been taught Hand Expression?: Yes Does the patient have breastfeeding experience prior to this delivery?: No  Feeding Feeding Type: Breast Fed Length of feed: 3 min  LATCH Score/Interventions Latch: Repeated attempts needed to sustain latch, nipple held in mouth throughout feeding, stimulation needed to elicit sucking reflex. Intervention(s): Adjust position;Assist with latch;Breast massage;Breast compression  Audible Swallowing: A few with stimulation  Type of Nipple: Everted at rest and after stimulation (short nipple shafts bilateral)  Comfort (Breast/Nipple): Soft / non-tender     Hold (Positioning): Assistance needed to correctly position  infant at breast and maintain latch. Intervention(s): Breastfeeding basics reviewed;Support Pillows;Position options;Skin to skin  LATCH Score: 7  Lactation Tools Discussed/Used Tools: Shells;Nipple Dorris CarnesShields;Pump Nipple shield size: 16 Shell Type: Inverted Breast pump type: Manual WIC Program: No   Consult Status Consult Status: Follow-up Date: 03/25/16 Follow-up type: In-patient    Alfred LevinsGranger, Adrine Hayworth Ann 03/25/2016, 10:59 AM

## 2016-03-25 NOTE — Lactation Note (Signed)
This note was copied from a baby's chart. Lactation Consultation Note  Patient Name: Diane Vira AgarJennifer Siglin OZHYQ'MToday's Date: 03/25/2016 Reason for consult: Follow-up assessment   With this mom of a term baby, now 7528 hours old. The baby is under a single phototherapy blanket. Blanket kept over baby during lengthy breast feeding. Mom had baby latched with nipple shield, but was feeling discomfort. The baby needed to be advanced past the nipple and shield, and his lower lip needed to be pulled out. Once baby on  The breast and latched deeply, strong suckles, and mom felt comfortable. He active suckled for over 40 minutes, and there was a lot of colostrum seen in the shield after the feeding.  I set mom up with DEP, and fitted her with 21 flanges, and gave her coconut oil to lubricate her nipples with pumping.  Since the baby did so well with this feeding, and there was so much colostrum seen after, in the shield, I told mom if he continues to feed this well, to pump about 4 times a day. If not, try to pump 8 times a day, every 3 hours. The  Mom had colostrum dripping from her right brast, while breastfeeding on the left.  I tried to get the baby latched without the shield, after his feeding, and he was rooting and trying, but was not able to without the shield. Mom held him skin to skin, with phototherapy lights over him. Baby and Me book and lactation services on breastfeeding reviewed with mom.    Maternal Data    Feeding Feeding Type: Breast Fed Length of feed: 37 min  LATCH Score/Interventions Latch: Repeated attempts needed to sustain latch, nipple held in mouth throughout feeding, stimulation needed to elicit sucking reflex. Intervention(s): Skin to skin;Teach feeding cues;Waking techniques Intervention(s): Adjust position;Assist with latch;Breast compression  Audible Swallowing: Spontaneous and intermittent Intervention(s): Skin to skin;Hand expression Intervention(s): Hand expression;Skin to  skin  Type of Nipple: Everted at rest and after stimulation  Comfort (Breast/Nipple): Soft / non-tender     Hold (Positioning): Assistance needed to correctly position infant at breast and maintain latch. Intervention(s): Breastfeeding basics reviewed;Support Pillows;Position options;Skin to skin  LATCH Score: 8  Lactation Tools Discussed/Used Tools: Pump Nipple shield size: 16 Breast pump type: Manual Pump Review: Setup, frequency, and cleaning;Milk Storage;Other (comment) (mom knows how to hand express,  pump settings reviewed with mom) Initiated by:: Danton Claphristine Ausencio Vaden, RN, IBCLC Date initiated:: 03/25/16   Consult Status Consult Status: Follow-up Date: 03/26/16 Follow-up type: In-patient    Alfred LevinsLee, Suriya Kovarik Anne 03/25/2016, 5:02 PM

## 2016-03-26 MED ORDER — PRAMOXINE-HC 1-2.5 % EX CREA: TOPICAL_CREAM | Freq: Four times a day (QID) | CUTANEOUS | 2 refills | Status: DC

## 2016-03-26 MED ORDER — MAGNESIUM OXIDE 400 MG PO TABS: 400.0000 mg | ORAL_TABLET | Freq: Every day | ORAL | 0 refills | Status: DC

## 2016-03-26 MED ORDER — IBUPROFEN 800 MG PO TABS: 800.0000 mg | ORAL_TABLET | Freq: Three times a day (TID) | ORAL | 0 refills | Status: DC | PRN

## 2016-03-26 MED ORDER — IBUPROFEN 800 MG PO TABS: 800.0000 mg | ORAL_TABLET | Freq: Four times a day (QID) | ORAL | 0 refills | Status: DC

## 2016-03-26 MED ORDER — POLYSACCHARIDE IRON COMPLEX 150 MG PO CAPS: 150.0000 mg | ORAL_CAPSULE | Freq: Every day | ORAL | 0 refills | Status: DC

## 2016-03-26 MED ORDER — HYDROCORTISONE ACE-PRAMOXINE 2.5-1 % EX CREA: 1.0000 "application " | TOPICAL_CREAM | Freq: Four times a day (QID) | CUTANEOUS | 2 refills | Status: DC

## 2016-03-26 MED ORDER — OXYCODONE-ACETAMINOPHEN 5-325 MG PO TABS: 1.0000 | ORAL_TABLET | Freq: Four times a day (QID) | ORAL | 0 refills | Status: DC | PRN

## 2016-03-26 NOTE — Discharge Summary (Signed)
OB Discharge Summary  Patient Name: Diane AgarJennifer [name] DOB: June 30, 1986 MRN: [id number]  Date of admission: 03/24/2016  Admitting diagnosis:41 weeks prolonged active phase of labor  Intrauterine pregnancy: 4251w0d      Date of discharge: 03/26/2016    Discharge diagnosis: Term Pregnancy Delivered and Anemia      Prenatal history: G1P1001   EDC : 03/17/2016, by Last Menstrual Period  [facility] Primary provider : Marlinda Mikeanya Dosha Broshears CNM Prenatal course complicated by post-dates  Prenatal Labs: ABO, Rh: --/--/O POS, O POS (09/19 0255) Antibody: NEG (09/19 0255) Rubella: Immune (02/06 0000)  RPR: Non Reactive (09/19 0255)  HBsAg: Negative (02/06 0000)  HIV: Non-reactive (02/06 0000)  GBS: Negative (08/16 0000)                                    Hospital course:  Onset of Labor With Vaginal Delivery     30 y.o. yo G1P1001 at 251w0d was admitted in Active Labor on 03/24/2016 from the birth center for prolonged active phase of labor with persistent OP presentation.  Membrane Rupture Time/Date: 8:00 PM ,03/23/2016    Intrapartum Procedures: Episiotomy: Median [2]                                         Lacerations:  Perineal [11];2nd degree [3]  Patient had a delivery of a Viable infant. 03/24/2016  Information for the patient's newborn:  Lucita LoraBiggs, Boy Diane [[id number]][[id number]]  Delivery Method: Vaginal, Vacuum (Extractor) (Filed from Delivery Summary)    Pateint had an uncomplicated postpartum course.  She is ambulating, tolerating a regular diet, passing flatus, and urinating well. Patient is discharged home in stable condition on 03/26/16.   Augmentation: Pitocin Delivering PROVIDER: Olivia MackieAAVON, RICHARD                                                            Complications: None  Newborn Data: Live born female  Birth Weight: 7 lb 3.7 oz (3280 g) APGAR: 2, 5  Baby Feeding: Breast Disposition:home with mother  Post partum procedures:none  Postpartum  contraception: Not Discussed    Labs: Lab Results  Component Value Date   WBC 12.4 (H) 03/25/2016   HGB 8.9 (L) 03/25/2016   HCT 25.7 (L) 03/25/2016   MCV 92.4 03/25/2016   PLT 164 03/25/2016   CMP Latest Ref Rng & Units 05/07/2015  Glucose 65 - 99 mg/dL 474(Q161(H)  BUN 6 - 20 mg/dL 6  Creatinine 5.950.44 - 6.381.00 mg/dL 7.560.76  Sodium 433135 - 295145 mmol/L 130(L)  Potassium 3.5 - 5.1 mmol/L 4.6  Chloride 101 - 111 mmol/L 99(L)  CO2 22 - 32 mmol/L 22  Calcium 8.9 - 10.3 mg/dL 7.9(L)  Total Protein 6.5 - 8.1 g/dL -  Total Bilirubin 0.3 - 1.2 mg/dL -  Alkaline Phos 38 - 188126 U/L -  AST 15 - 41 U/L -  ALT 14 - 54 U/L -    Physical Exam @ time of discharge:  Vitals:   03/25/16 0037 03/25/16 0558 03/25/16 1839 03/26/16 0528  BP: (!) 91/50 (!) 89/51 Marland Kitchen(!)  96/52 (!) 93/51  Pulse: 82 73 85 69  Resp: 18 16 17 18   Temp: 98.2 F (36.8 C) 98.1 F (36.7 C) 98.2 F (36.8 C) 97.6 F (36.4 C)  TempSrc: Axillary Oral Axillary   SpO2:      Weight:      Height:        General: alert, cooperative and no distress Lochia: appropriate Uterine Fundus: firm Perineum: mild edema / external hemorrhoids Incision: Healing well with no significant drainage Extremities: DVT Evaluation: No evidence of DVT seen on physical exam.   Discharge instructions:  "Baby and Me Booklet" and [facility] Booklet  Discharge Medications:    Medication List    TAKE these medications   Hydrocortisone Ace-Pramoxine 2.5-1 % Crea Apply 1 application topically 4 (four) times daily.   ibuprofen 800 MG tablet Commonly known as:  ADVIL,MOTRIN Take 1 tablet (800 mg total) by mouth every 6 (six) hours.   iron polysaccharides 150 MG capsule Commonly known as:  NIFEREX Take 1 capsule (150 mg total) by mouth daily.   magnesium oxide 400 MG tablet Commonly known as:  MAG-OX Take 1 tablet (400 mg total) by mouth daily.   oxyCODONE-acetaminophen 5-325 MG tablet Commonly known as:  PERCOCET/ROXICET Take 1 tablet by mouth every  6 (six) hours as needed for moderate pain.   prenatal multivitamin Tabs tablet Take 1 tablet by mouth daily at 12 noon.       Diet: routine diet  Activity: Advance as tolerated. Pelvic rest x 6 weeks.   Follow up:2 weeks    Signed: Marlinda Mike CNM, MSN, [facility] 03/26/2016, 10:06 AM

## 2016-03-26 NOTE — Progress Notes (Signed)
PPD 2 VAVD  S:  Reports feeling sore but ok             Tolerating po/ No nausea or vomiting             Bleeding is light             Pain controlled with motrin and occasional percocet             Up ad lib / ambulatory / voiding QS  Newborn breast feeding  O:               VS: BP (!) 93/51   Pulse 69   Temp 97.6 F (36.4 C)   Resp 18   Ht 5\' 3"  (1.6 m)   Wt 65.8 kg (145 lb)   LMP 06/11/2015 Comment: states has period every 3 months due to bc  SpO2 97%   Breastfeeding? Unknown   BMI 25.69 kg/m    LABS:              Recent Labs  03/24/16 0255 03/25/16 0607  WBC 16.9* 12.4*  HGB 12.7 8.9*  PLT 213 164               Blood type: --/--/O POS, O POS (09/19 0255)  Rubella: Immune (02/06 0000)                              Physical Exam:             Alert and oriented X3  Abdomen: soft, non-tender, non-distended              Fundus: firm, non-tender, U-1  Perineum: mild edema  Lochia: light  Extremities: trace edema, no calf pain or tenderness    A: PPD # 2   Doing well - stable status  P: Routine post partum orders  DC home  Marlinda MikeBAILEY, TANYA CNM, MSN, [facility] 03/26/2016, 9:38 AM

## 2016-06-09 ENCOUNTER — Encounter (HOSPITAL_COMMUNITY): Payer: Self-pay | Admitting: *Deleted

## 2016-06-10 ENCOUNTER — Other Ambulatory Visit: Payer: Self-pay | Admitting: Obstetrics and Gynecology

## 2016-06-11 NOTE — H&P (Signed)
NAMVira [name]:  [name], Diane              ACCOUNT NO.:  [id number]  MEDICAL RECORD NO.:  [id number]  LOCATION:  9135                          FACILITY:  [facility]  PHYSICIAN:  Lenoard Adenichard J. Else Habermann, M.D.DATE OF BIRTH:  1986/02/04  DATE OF ADMISSION:  03/24/2016 DATE OF DISCHARGE:  03/26/2016                             HISTORY & PHYSICAL   CSN:  [id number].  CHIEF COMPLAINT:  Poorly healed perineal dehiscence status post complicated vaginal delivery.  HISTORY OF PRESENT ILLNESS:  She is a 30 year old white female, status post prolonged labor and operative vaginal delivery on March 24, 2016, who presents now for followup of poorly healing dehiscence of perineal scar for revision and removal of scar with secondary repair.  MEDICATIONS:  Include prenatal vitamins and Camila.  ALLERGIES:  She has no known drug allergies.  FAMILY HISTORY:  Remarkable for bladder cancer, heart disease, breast cancer, and endometriosis.  SOCIAL HISTORY:  Nonsmoker, nondrinker.  Denies domestic physical violence.  SURGICAL HISTORY:  Remarkable for appendectomy.  PHYSICAL EXAM:  GENERAL:  Well-developed, well-nourished white female, in no acute distress. HEENT:  Normal. NECK:  Supple.  Full range of motion. LUNGS:  Clear. HEART:  Regular rate and rhythm. ABDOMEN:  Soft, nontender. PELVIC EXAM:  Reveals midline dehiscence.  Perineal scar with granulation tissue poorly healed after 11 weeks postpartum.  There is also an area in the right vaginal area of a poorly-healed lateral wall laceration. EXTREMITIES:  No cords. NEUROLOGIC:  Nonfocal. SKIN:  Intact.  IMPRESSION:  Poorly healed perineal dehiscence status post vaginal delivery with perineal laceration.  PLAN:  Proceed with perineal revision repair.  Risks of anesthesia, incision infection, bleeding, injury to surrounding organs, possible need for repair was discussed.  Delayed versus immediate complications including bowel and bladder injury are  noted.  The patient acknowledges and wishes to proceed.     Lenoard Adenichard J. Addaline Peplinski, M.D.     RJT/MEDQ  D:  06/11/2016  T:  06/11/2016  Job:  [id number]

## 2016-06-11 NOTE — H&P (Deleted)
  The note originally documented on this encounter has been moved the the encounter in which it belongs.  

## 2016-06-12 ENCOUNTER — Encounter (HOSPITAL_COMMUNITY): Payer: Self-pay

## 2016-06-12 ENCOUNTER — Ambulatory Visit (HOSPITAL_COMMUNITY)
Admission: RE | Admit: 2016-06-12 | Discharge: 2016-06-12 | Disposition: A | Discharge status: Home / Self Care | Payer: 59 | Source: Ambulatory Visit | Attending: Obstetrics and Gynecology | Admitting: Obstetrics and Gynecology

## 2016-06-12 ENCOUNTER — Ambulatory Visit (HOSPITAL_COMMUNITY): Payer: 59 | Admitting: Anesthesiology

## 2016-06-12 ENCOUNTER — Encounter (HOSPITAL_COMMUNITY)
Admission: RE | Disposition: A | Discharge status: Home / Self Care | Payer: Self-pay | Source: Ambulatory Visit | Attending: Obstetrics and Gynecology

## 2016-06-12 DIAGNOSIS — N8189 Other female genital prolapse: Secondary | ICD-10-CM | POA: Diagnosis not present

## 2016-06-12 DIAGNOSIS — T8132XA Disruption of internal operation (surgical) wound, not elsewhere classified, initial encounter: Secondary | ICD-10-CM | POA: Insufficient documentation

## 2016-06-12 DIAGNOSIS — A58 Granuloma inguinale: Secondary | ICD-10-CM | POA: Diagnosis not present

## 2016-06-12 DIAGNOSIS — Y838 Other surgical procedures as the cause of abnormal reaction of the patient, or of later complication, without mention of misadventure at the time of the procedure: Secondary | ICD-10-CM | POA: Insufficient documentation

## 2016-06-12 HISTORY — PX: PERINEAL LACERATION REPAIR: SHX5389

## 2016-06-12 HISTORY — DX: Anemia, unspecified: D64.9

## 2016-06-12 LAB — CBC
HCT: 35.6 % — ABNORMAL LOW (ref 36.0–46.0)
Hemoglobin: 11.9 g/dL — ABNORMAL LOW (ref 12.0–15.0)
MCH: 30.4 pg (ref 26.0–34.0)
MCHC: 33.4 g/dL (ref 30.0–36.0)
MCV: 90.8 fL (ref 78.0–100.0)
Platelets: 240 10*3/uL (ref 150–400)
RBC: 3.92 MIL/uL (ref 3.87–5.11)
RDW: 13.8 % (ref 11.5–15.5)
WBC: 5.2 10*3/uL (ref 4.0–10.5)

## 2016-06-12 SURGERY — SUTURE REPAIR, LACERATION, PERINEUM
Anesthesia: General | Site: Perineum

## 2016-06-12 MED ORDER — LACTATED RINGERS IV SOLN
INTRAVENOUS | Status: DC
  Administered 2016-06-12 (×2): via INTRAVENOUS

## 2016-06-12 MED ORDER — MIDAZOLAM HCL 5 MG/5ML IJ SOLN
INTRAMUSCULAR | Status: DC | PRN
  Administered 2016-06-12: 2 mg via INTRAVENOUS

## 2016-06-12 MED ORDER — FENTANYL CITRATE (PF) 100 MCG/2ML IJ SOLN
INTRAMUSCULAR | Status: AC
  Filled 2016-06-12: qty 2

## 2016-06-12 MED ORDER — PROPOFOL 10 MG/ML IV BOLUS
INTRAVENOUS | Status: DC | PRN
  Administered 2016-06-12: 200 mg via INTRAVENOUS

## 2016-06-12 MED ORDER — KETOROLAC TROMETHAMINE 30 MG/ML IJ SOLN
INTRAMUSCULAR | Status: DC | PRN
  Administered 2016-06-12: 30 mg via INTRAVENOUS

## 2016-06-12 MED ORDER — DEXAMETHASONE SODIUM PHOSPHATE 10 MG/ML IJ SOLN
INTRAMUSCULAR | Status: AC
  Filled 2016-06-12: qty 1

## 2016-06-12 MED ORDER — LIDOCAINE HCL (CARDIAC) 20 MG/ML IV SOLN
INTRAVENOUS | Status: AC
  Filled 2016-06-12: qty 5

## 2016-06-12 MED ORDER — LIDOCAINE-EPINEPHRINE (PF) 1 %-1:200000 IJ SOLN
INTRAMUSCULAR | Status: AC
  Filled 2016-06-12: qty 30

## 2016-06-12 MED ORDER — MIDAZOLAM HCL 2 MG/2ML IJ SOLN
INTRAMUSCULAR | Status: AC
  Filled 2016-06-12: qty 2

## 2016-06-12 MED ORDER — OXYCODONE HCL 5 MG PO TABS
ORAL_TABLET | ORAL | Status: AC
  Filled 2016-06-12: qty 1

## 2016-06-12 MED ORDER — BUPIVACAINE HCL (PF) 0.25 % IJ SOLN
INTRAMUSCULAR | Status: DC | PRN
  Administered 2016-06-12: 10 mL

## 2016-06-12 MED ORDER — OXYCODONE-ACETAMINOPHEN 5-325 MG PO TABS: 1.0000 | ORAL_TABLET | ORAL | 0 refills | Status: DC | PRN

## 2016-06-12 MED ORDER — BUPIVACAINE HCL (PF) 0.25 % IJ SOLN
INTRAMUSCULAR | Status: AC
  Filled 2016-06-12: qty 30

## 2016-06-12 MED ORDER — SCOPOLAMINE 1 MG/3DAYS TD PT72: 1.0000 | MEDICATED_PATCH | Freq: Once | TRANSDERMAL | Status: DC

## 2016-06-12 MED ORDER — ACETAMINOPHEN 325 MG PO TABS: 325.0000 mg | ORAL_TABLET | ORAL | Status: DC | PRN

## 2016-06-12 MED ORDER — SCOPOLAMINE 1 MG/3DAYS TD PT72
MEDICATED_PATCH | TRANSDERMAL | Status: AC
  Filled 2016-06-12: qty 1

## 2016-06-12 MED ORDER — LIDOCAINE HCL (CARDIAC) 20 MG/ML IV SOLN
INTRAVENOUS | Status: DC | PRN
  Administered 2016-06-12: 50 mg via INTRAVENOUS

## 2016-06-12 MED ORDER — DEXAMETHASONE SODIUM PHOSPHATE 4 MG/ML IJ SOLN
INTRAMUSCULAR | Status: DC | PRN
Start: 2016-06-12 — End: 2016-06-12
  Administered 2016-06-12: 8 mg via INTRAVENOUS

## 2016-06-12 MED ORDER — KETOROLAC TROMETHAMINE 30 MG/ML IJ SOLN
INTRAMUSCULAR | Status: AC
  Filled 2016-06-12: qty 1

## 2016-06-12 MED ORDER — PROPOFOL 10 MG/ML IV BOLUS
INTRAVENOUS | Status: AC
  Filled 2016-06-12: qty 20

## 2016-06-12 MED ORDER — FENTANYL CITRATE (PF) 100 MCG/2ML IJ SOLN
INTRAMUSCULAR | Status: DC | PRN
  Administered 2016-06-12 (×2): 50 ug via INTRAVENOUS
  Administered 2016-06-12: 100 ug via INTRAVENOUS

## 2016-06-12 MED ORDER — ONDANSETRON HCL 4 MG/2ML IJ SOLN
INTRAMUSCULAR | Status: DC | PRN
  Administered 2016-06-12: 4 mg via INTRAVENOUS

## 2016-06-12 MED ORDER — OXYCODONE HCL 5 MG PO TABS
5.0000 mg | ORAL_TABLET | Freq: Once | ORAL | Status: AC | PRN
  Administered 2016-06-12: 5 mg via ORAL

## 2016-06-12 MED ORDER — GLYCOPYRROLATE 0.2 MG/ML IJ SOLN
INTRAMUSCULAR | Status: AC
  Filled 2016-06-12: qty 1

## 2016-06-12 MED ORDER — GLYCOPYRROLATE 0.2 MG/ML IJ SOLN
INTRAMUSCULAR | Status: DC | PRN
  Administered 2016-06-12: 0.2 mg via INTRAVENOUS

## 2016-06-12 MED ORDER — MEPERIDINE HCL 25 MG/ML IJ SOLN: 6.2500 mg | INTRAMUSCULAR | Status: DC | PRN

## 2016-06-12 MED ORDER — FENTANYL CITRATE (PF) 100 MCG/2ML IJ SOLN
25.0000 ug | INTRAMUSCULAR | Status: DC | PRN
  Administered 2016-06-12 (×2): 50 ug via INTRAVENOUS

## 2016-06-12 MED ORDER — ACETAMINOPHEN 160 MG/5ML PO SOLN: 325.0000 mg | ORAL | Status: DC | PRN

## 2016-06-12 MED ORDER — ONDANSETRON HCL 4 MG/2ML IJ SOLN: 4.0000 mg | Freq: Once | INTRAMUSCULAR | Status: DC | PRN

## 2016-06-12 MED ORDER — ONDANSETRON HCL 4 MG/2ML IJ SOLN
INTRAMUSCULAR | Status: AC
  Filled 2016-06-12: qty 2

## 2016-06-12 MED ORDER — OXYCODONE HCL 5 MG/5ML PO SOLN: 5.0000 mg | Freq: Once | ORAL | Status: AC | PRN

## 2016-06-12 MED ORDER — KETOROLAC TROMETHAMINE 30 MG/ML IJ SOLN: 30.0000 mg | Freq: Once | INTRAMUSCULAR | Status: DC

## 2016-06-12 MED ORDER — CEFAZOLIN SODIUM-DEXTROSE 2-4 GM/100ML-% IV SOLN
2.0000 g | INTRAVENOUS | Status: AC
  Administered 2016-06-12: 2 g via INTRAVENOUS

## 2016-06-12 SURGICAL SUPPLY — 24 items
BLADE SURG 15 STRL LF C SS BP (BLADE) ×1 IMPLANT
BLADE SURG 15 STRL SS (BLADE) ×2
CLOTH BEACON ORANGE TIMEOUT ST (SAFETY) ×3 IMPLANT
CONTAINER PREFILL 10% NBF 60ML (FORM) IMPLANT
COUNTER NEEDLE 1200 MAGNETIC (NEEDLE) ×3 IMPLANT
DRAPE SHEET LG 3/4 BI-LAMINATE (DRAPES) ×6 IMPLANT
ELECT REM PT RETURN 9FT ADLT (ELECTROSURGICAL) ×3
ELECTRODE REM PT RTRN 9FT ADLT (ELECTROSURGICAL) ×1 IMPLANT
GLOVE BIO SURGEON STRL SZ7.5 (GLOVE) ×3 IMPLANT
GLOVE BIOGEL PI IND STRL 7.0 (GLOVE) ×1 IMPLANT
GLOVE BIOGEL PI INDICATOR 7.0 (GLOVE) ×2
GOWN STRL REUS W/TWL LRG LVL3 (GOWN DISPOSABLE) ×9 IMPLANT
NEEDLE HYPO 22GX1.5 SAFETY (NEEDLE) ×3 IMPLANT
PACK VAGINAL MINOR WOMEN LF (CUSTOM PROCEDURE TRAY) ×3 IMPLANT
PAD OB MATERNITY 4.3X12.25 (PERSONAL CARE ITEMS) ×3 IMPLANT
PAD PREP 24X48 CUFFED NSTRL (MISCELLANEOUS) ×3 IMPLANT
PENCIL BUTTON HOLSTER BLD 10FT (ELECTRODE) ×3 IMPLANT
SUT VICRYL RAPIDE 2 0 (SUTURE) ×15 IMPLANT
SUT VICRYL RAPIDE 3 0 (SUTURE) ×9 IMPLANT
TOWEL OR 17X24 6PK STRL BLUE (TOWEL DISPOSABLE) ×6 IMPLANT
TUBING NON-CON 1/4 X 20 CONN (TUBING) ×2 IMPLANT
TUBING NON-CON 1/4 X 20' CONN (TUBING) ×1
WATER STERILE IRR 1000ML POUR (IV SOLUTION) ×3 IMPLANT
YANKAUER SUCT BULB TIP NO VENT (SUCTIONS) ×3 IMPLANT

## 2016-06-12 NOTE — Progress Notes (Signed)
Patient seen and examined. Consent witnessed and signed. No changes noted. Update completed.Patient ID: Diane AgarJennifer Eliot, female   DOB: 1986-03-06, 30 y.o.   MRN: [id number]

## 2016-06-12 NOTE — Anesthesia Procedure Notes (Signed)
Procedure Name: LMA Insertion Date/Time: 06/12/2016 12:26 PM Performed by: Junious SilkGILBERT, Dewain Platz Pre-anesthesia Checklist: Patient identified, Emergency Drugs available, Suction available and Patient being monitored Patient Re-evaluated:Patient Re-evaluated prior to inductionOxygen Delivery Method: Circle system utilized Preoxygenation: Pre-oxygenation with 100% oxygen Intubation Type: IV induction Ventilation: Mask ventilation without difficulty LMA: LMA inserted LMA Size: 3.0 Number of attempts: 1 Dental Injury: Teeth and Oropharynx as per pre-operative assessment

## 2016-06-12 NOTE — Anesthesia Preprocedure Evaluation (Signed)
Anesthesia Evaluation  Patient identified by MRN, date of birth, ID band Patient awake    Reviewed: Allergy & Precautions, H&P , NPO status , Patient's Chart, lab work & pertinent test results  Airway Mallampati: I  TM Distance: >3 FB Neck ROM: full    Dental no notable dental hx. (+) Teeth Intact   Pulmonary neg pulmonary ROS,    Pulmonary exam normal        Cardiovascular negative cardio ROS Normal cardiovascular exam     Neuro/Psych negative neurological ROS  negative psych ROS   GI/Hepatic negative GI ROS, Neg liver ROS,   Endo/Other  negative endocrine ROS  Renal/GU negative Renal ROS     Musculoskeletal   Abdominal Normal abdominal exam  (+)   Peds  Hematology   Anesthesia Other Findings   Reproductive/Obstetrics negative OB ROS                             Anesthesia Physical Anesthesia Plan  ASA: II  Anesthesia Plan: General   Post-op Pain Management:    Induction: Intravenous  Airway Management Planned: LMA  Additional Equipment:   Intra-op Plan:   Post-operative Plan:   Informed Consent: I have reviewed the patients History and Physical, chart, labs and discussed the procedure including the risks, benefits and alternatives for the proposed anesthesia with the patient or authorized representative who has indicated his/her understanding and acceptance.     Plan Discussed with: CRNA and Surgeon  Anesthesia Plan Comments:         Anesthesia Quick Evaluation

## 2016-06-12 NOTE — Anesthesia Postprocedure Evaluation (Signed)
Anesthesia Post Note  Patient: Diane AgarJennifer [name]  Procedure(s) Performed: Procedure(s) (LRB): Revision/REPAIR of Postpartum PERINEAL Dehiscence (N/A)  Patient location during evaluation: PACU Anesthesia Type: General Level of consciousness: awake Pain management: pain level controlled Vital Signs Assessment: post-procedure vital signs reviewed and stable Respiratory status: spontaneous breathing Cardiovascular status: stable Postop Assessment: no signs of nausea or vomiting Anesthetic complications: no     Last Vitals:  Vitals:   06/12/16 1430 06/12/16 1445  BP: 103/73 102/68  Pulse: (!) 55 60  Resp: 10 12  Temp:  37.7 C    Last Pain:  Vitals:   06/12/16 1445  TempSrc:   PainSc: 4    Pain Goal: Patients Stated Pain Goal: 2 (06/12/16 1445)               Jennyfer Nickolson JR,JOHN Susann GivensFRANKLIN

## 2016-06-12 NOTE — Op Note (Signed)
06/12/2016  1:09 PM  PATIENT:  Diane AgarJennifer [name]  30 y.o. female  PRE-OPERATIVE DIAGNOSIS:  Perineal Wound Dehiscence  POST-OPERATIVE DIAGNOSIS:  Perineal Wound Dehiscence  PROCEDURE:  Procedure(s): Revision/REPAIR of Postpartum PERINEAL Dehiscence Excision of perineal granulation tissue  SURGEON:  Surgeon(s): Olivia Mackieichard Shaton Lore, MD  ASSISTANTS: none   ANESTHESIA:   local and general  ESTIMATED BLOOD LOSS: minimal  DRAINS: none   LOCAL MEDICATIONS USED:  MARCAINE    and Amount: 10 ml  SPECIMEN:  No Specimen  DISPOSITION OF SPECIMEN:  N/A  COUNTS:  YES  DICTATION #: [id number]  PLAN OF CARE: dc home  PATIENT DISPOSITION:  PACU - hemodynamically stable.

## 2016-06-12 NOTE — Discharge Instructions (Signed)
Do not take ibuprofen/Motrin/Advil products before 7pm 06/12/16.

## 2016-06-12 NOTE — Transfer of Care (Signed)
Immediate Anesthesia Transfer of Care Note  Patient: Diane [name]  Procedure(s) Performed: Procedure(s): Revision/REPAIR of Postpartum PERINEAL Dehiscence (N/A)  Patient Location: PACU  Anesthesia Type:General  Level of Consciousness: awake, alert  and oriented  Airway & Oxygen Therapy: Patient Spontanous Breathing and Patient connected to nasal cannula oxygen  Post-op Assessment: Report given to RN and Post -op Vital signs reviewed and stable  Post vital signs: Reviewed and stable  Last Vitals:  Vitals:   06/12/16 1039  BP: 101/77  Pulse: 69  Resp: 16  Temp: 36.7 C    Last Pain:  Vitals:   06/12/16 1039  TempSrc: Oral  PainSc: 3       Patients Stated Pain Goal: 2 (06/12/16 1039)  Complications: No apparent anesthesia complications

## 2016-06-13 ENCOUNTER — Encounter (HOSPITAL_COMMUNITY): Payer: Self-pay | Admitting: Obstetrics and Gynecology

## 2016-06-15 NOTE — Op Note (Signed)
NAME:  Diane [name], Diane [name]                   ACCOUNT NO.:  MEDICAL RECORD NO.:  [id number]  LOCATION:                                 FACILITY:  PHYSICIAN:  Lenoard Adenichard J. Jackqueline Aquilar, M.D.DATE OF BIRTH:  09/27/1985  DATE OF PROCEDURE: DATE OF DISCHARGE:                              OPERATIVE REPORT   PREOPERATIVE DIAGNOSIS:  Perineal wound dehiscence with extensive granulation tissue status post uncomplicated vaginal delivery with perineal laceration 11 weeks ago.  POSTOPERATIVE DIAGNOSIS:  Perineal wound dehiscence with extensive granulation tissue status post uncomplicated vaginal delivery with perineal laceration 11 weeks ago.  PROCEDURE:  Revision and repair of postpartum perineal laceration dehiscence with extensive excision of granulation tissue.  SURGEON:  Lenoard Adenichard J. Maverick Patman, M.D.  ASSISTANT:  None.  ANESTHESIA:  Local general.  ESTIMATED BLOOD LOSS:  Less than 50 mL.  COMPLICATIONS:  None.  DRAINS:  None.  COUNTS:  Correct.  DISPOSITION:  The patient to recovery in good condition.  Local anesthetic used 10 mL of Marcaine.  BRIEF OPERATIVE NOTE:  After being apprised of risks of anesthesia, infection, bleeding, injury to surrounding organs, possible need for repair, delayed versus immediate complications to include bowel and bladder injury, possible need for repair; the patient was brought to the operating room.  She was administered general anesthetic without complications.  Prepped and draped in usual sterile fashion. Catheterized to the bladder was empty.  Exam under anesthesia revealed a normal size uterus and bilateral normal adnexa.  The midline perineum showed evidence of scar dehiscence with extensive granulation tissue and scarring in the midline focally at the posterior fourchette region in the right lateral vaginal wall.  There was extensive granulation tissue emanating from what appeared to have been a small right vaginal wall laceration, which is otherwise  healed.  At this time, the granulation tissue along the right lateral wall was excised sharply and with electrocautery, the base was coagulated with good hemostasis noted and the defect was closed using interrupted 3-0 Vicryl Rapide sutures.  Good hemostasis was noted.  At this time, attention was turned to the midline perineal dehiscence, whereby the focal area of scarring was excised in a V-shaped fashion.  The granulation tissue in the midline was also excised.  At this time, the incision was extended slightly cephalad. The vaginal wall was undermined in a cephalad fashion freeing up the vagina for closure.  The closure was then done in a horizontal fashion using interrupted 2-0 Vicryl Rapide sutures under little tension and she was done without difficulty.  Dilute Marcaine solution was placed in both of these repairs.  The patient tolerated the procedure well, was awakened and transferred to recovery in good condition.     Lenoard Adenichard J. Imer Foxworth, M.D.     RJT/MEDQ  D:  06/12/2016  T:  06/13/2016  Job:  [id number]

## 2017-05-03 LAB — OB RESULTS CONSOLE ABO/RH: RH Type: POSITIVE

## 2017-05-03 LAB — OB RESULTS CONSOLE GC/CHLAMYDIA
CHLAMYDIA, DNA PROBE: NEGATIVE
Gonorrhea: NEGATIVE

## 2017-05-03 LAB — OB RESULTS CONSOLE HEPATITIS B SURFACE ANTIGEN: HEP B S AG: NEGATIVE

## 2017-05-03 LAB — OB RESULTS CONSOLE RPR: RPR: NONREACTIVE

## 2017-05-03 LAB — OB RESULTS CONSOLE RUBELLA ANTIBODY, IGM: RUBELLA: IMMUNE

## 2017-05-03 LAB — OB RESULTS CONSOLE ANTIBODY SCREEN: Antibody Screen: NEGATIVE

## 2017-05-03 LAB — OB RESULTS CONSOLE HIV ANTIBODY (ROUTINE TESTING): HIV: NONREACTIVE

## 2017-06-03 IMAGING — CT CT ABD-PELV W/ CM
2 of 4 series · 15 of 46 positions shown, 17 images · IV contrast (APPLIED)
Comparison: None.

CLINICAL DATA: Initial encounter for right lower quadrant abdominal
pain since last night.

EXAM:
CT ABDOMEN AND PELVIS WITH CONTRAST
TECHNIQUE: Multidetector CT imaging of the abdomen and pelvis was performed
using the standard protocol following bolus administration of
intravenous contrast.
CONTRAST:  100mL OMNIPAQUE IOHEXOL 300 MG/ML  SOLN

[Series 2: abd/ pelvis 5.0 i30f 1 · axial · 0.71mm/px · z∈[+741,+1176]mm · 12 of 99 slices shown, 14 images]
[im 8/99  soft-tissue]
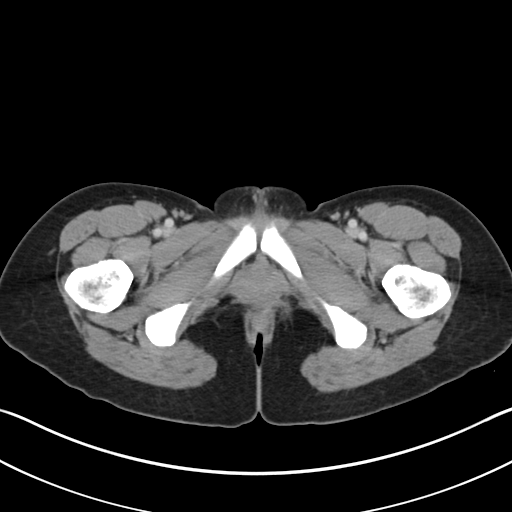
[im 8/99  bone]
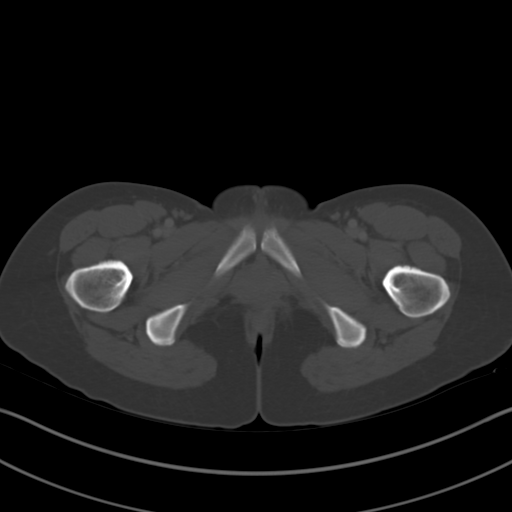
[im 16/99  soft-tissue]
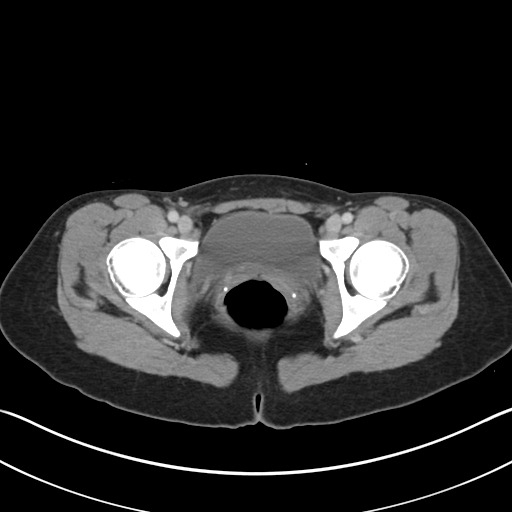
[im 24/99  soft-tissue]
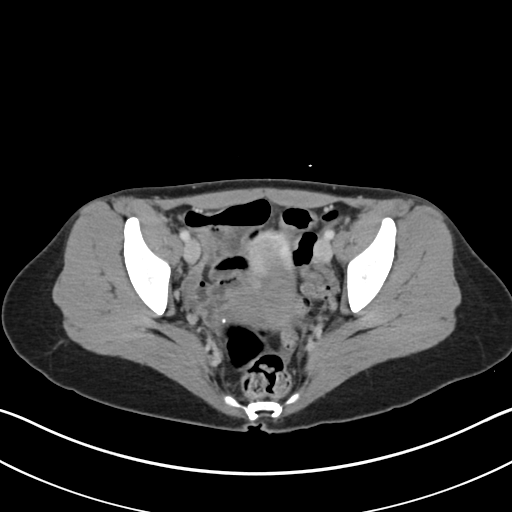
[im 32/99  soft-tissue]
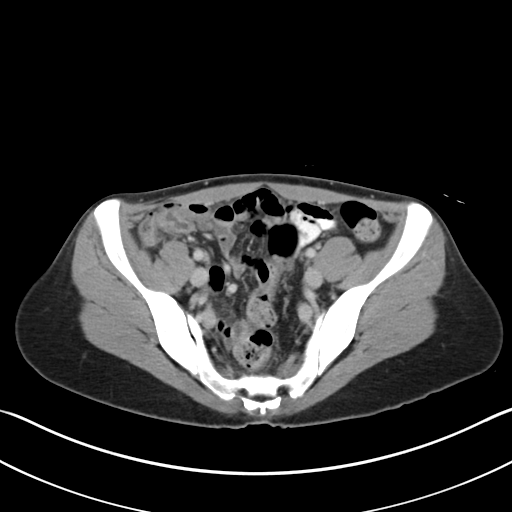
[im 40/99  soft-tissue]
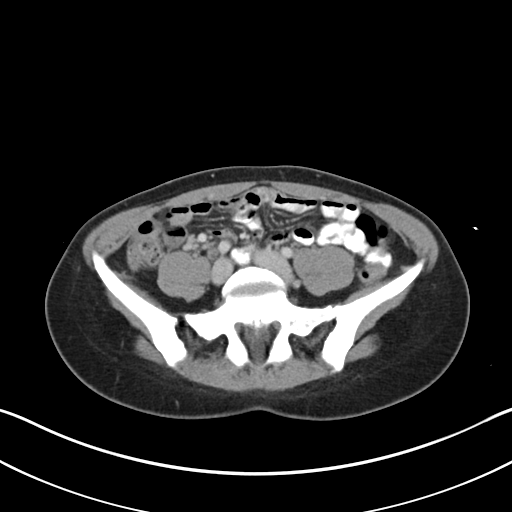
[im 48/99  soft-tissue]
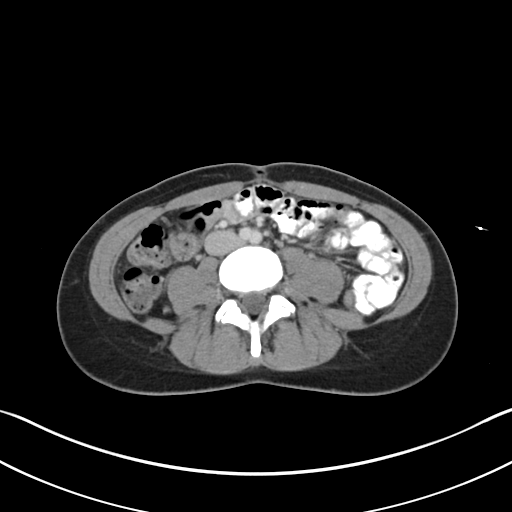
[im 55/99  soft-tissue]
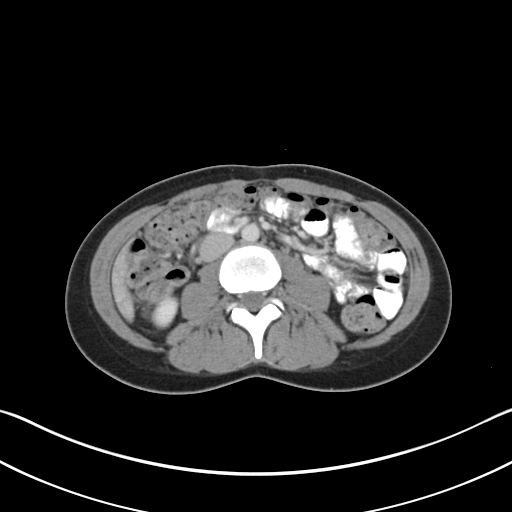
[im 63/99  soft-tissue]
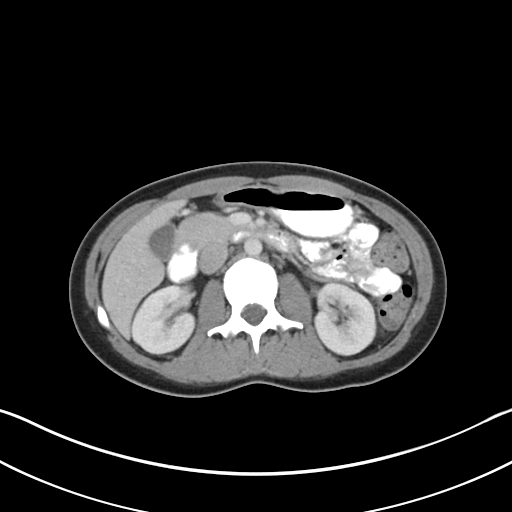
[im 71/99  soft-tissue]
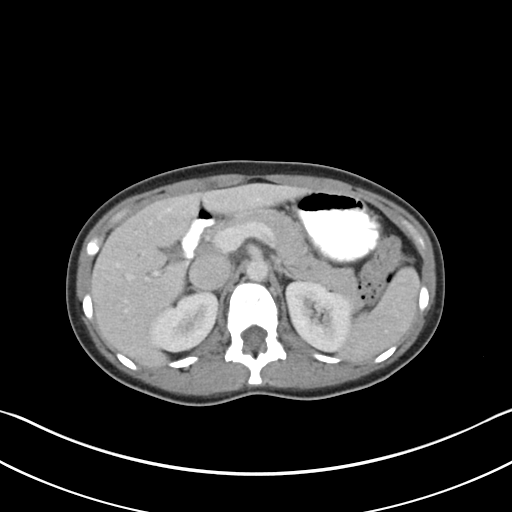
[im 71/99  bone]
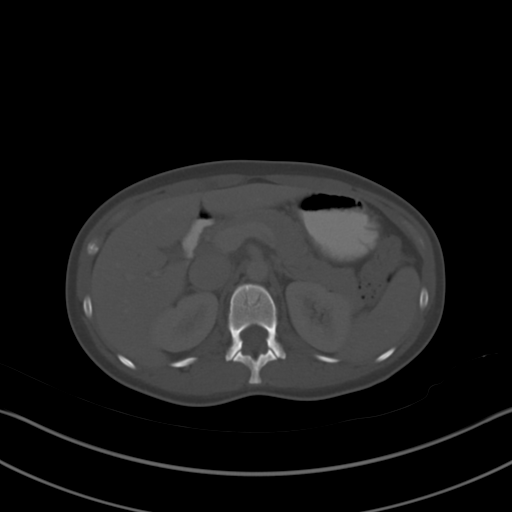
[im 79/99  soft-tissue]
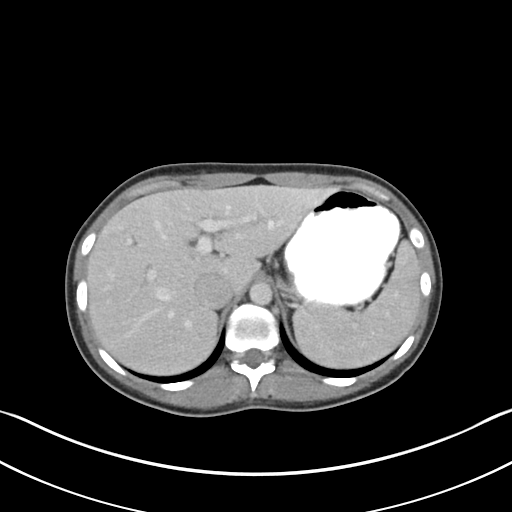
[im 87/99  soft-tissue]
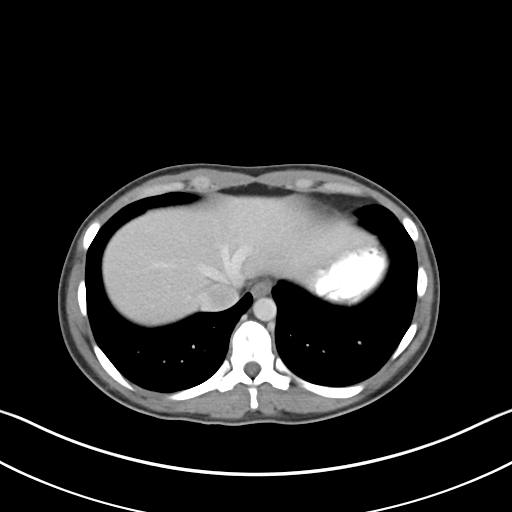
[im 95/99  soft-tissue]
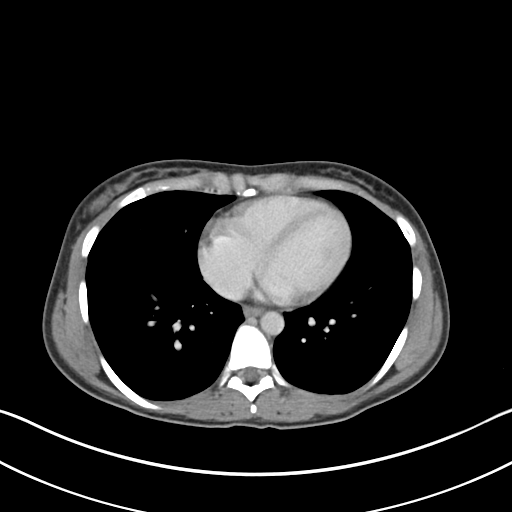

[Series 5: coronal soft tissue · coronal · 0.72mm/px · 3 of 68 slices shown]
[im 23/68  soft-tissue]
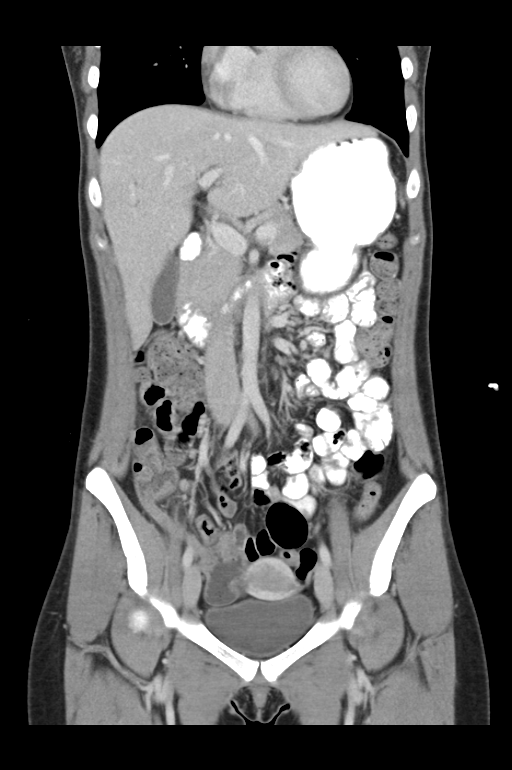
[im 30/68  soft-tissue]
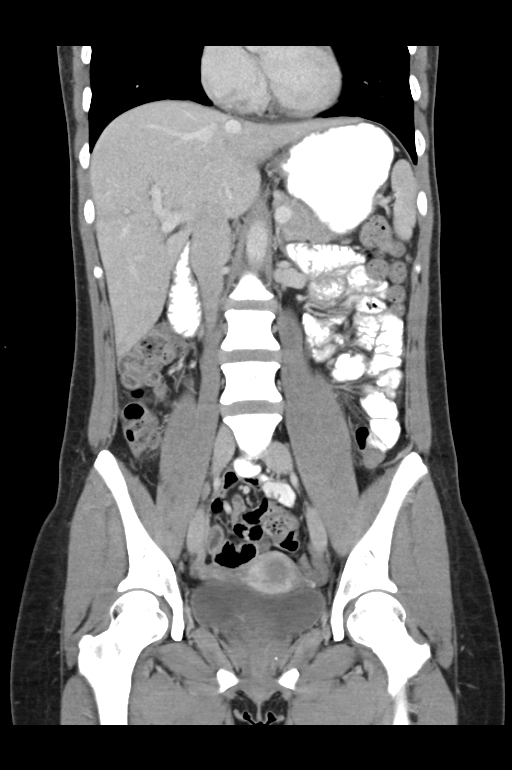
[im 38/68  soft-tissue]
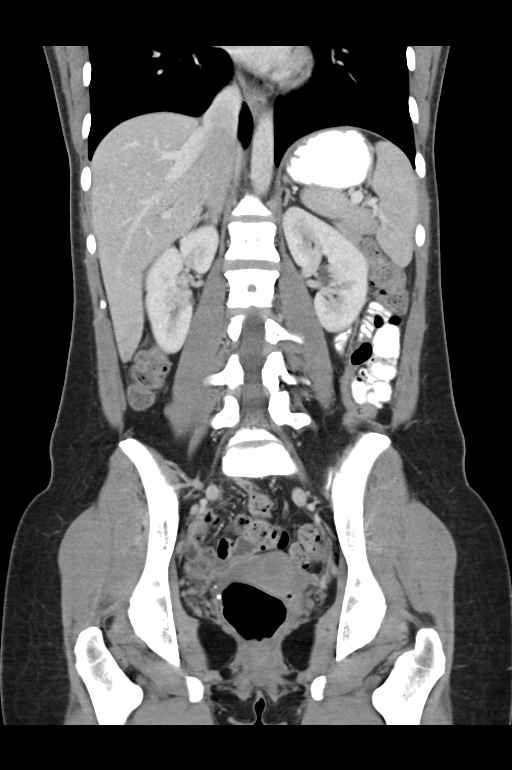

[15 of 46 positions shown; findings below may reference images not displayed]

FINDINGS: Lower chest:  Unremarkable.

Hepatobiliary: No focal abnormality within the liver parenchyma.
There is no evidence for gallstones, gallbladder wall thickening, or
pericholecystic fluid. No intrahepatic or extrahepatic biliary
dilation.

Pancreas: No focal mass lesion. No dilatation of the main duct. No
intraparenchymal cyst. No peripancreatic edema.

Spleen: No splenomegaly. No focal mass lesion.

Adrenals/Urinary Tract: No adrenal nodule or mass. Kidneys are
normal bilaterally. No evidence for hydroureter. The urinary bladder
appears normal for the degree of distention.

Stomach/Bowel: Stomach is nondistended. No gastric wall thickening.
No evidence of outlet obstruction. Duodenum is normally positioned
as is the ligament of Treitz. No small bowel wall thickening. No
small bowel dilatation. Terminal ileum is not well seen. The
appendix appears thickened, measuring up to 9 mm in diameter.
Appendiceal wall is ill-defined and there appears to be slight
hypovolemia in the wall of the appendix. Appendix is straightened
and well seen on coronal images 23 and 24 of series 5. No gross
colonic mass. No colonic wall thickening. No substantial
diverticular change.

Vascular/Lymphatic: No abdominal aortic aneurysm. No abdominal
atherosclerotic calcification. There is no gastrohepatic or
hepatoduodenal ligament lymphadenopathy. No intraperitoneal or
retroperitoneal lymphadenopy. No pelvic sidewall lymphadenopathy.

Reproductive: Uterus is unremarkable.  There is no adnexal mass.

Other: Trace free fluid is seen in the cul-de-sac.

Musculoskeletal: Bone windows reveal no worrisome lytic or sclerotic
osseous lesions.
IMPRESSION: Mild thickening of the appendiceal diameter associated with a
straightened configuration and ill definition of the wall. Together,
these imaging features are suspicious for acute appendicitis. No
evidence for perforation or abscess at this time.

I discussed these findings by telephone with the Ardouk Macuno , the
PA caring for the patient, the as , slow at 2220 hours on
05/06/2015.

## 2017-07-06 NOTE — L&D Delivery Note (Signed)
Delivery Note At 3:41 PM a viable and healthy female was delivered via  (Presentation: ROA  ).  APGAR: 8, 9; weight  pending .   Placenta status: spontaneous, intact.  Cord:  with the following complications: none.  Cord pH: na  Anesthesia:  epidural Episiotomy:  na Lacerations:  na Suture Repair: na Est. Blood Loss (mL):  200  Mom to postpartum.  Baby to Couplet care / Skin to Skin.  Jettie Mannor J 11/19/2017, 4:01 PM

## 2017-10-27 LAB — OB RESULTS CONSOLE GBS: GBS: NEGATIVE

## 2017-11-18 ENCOUNTER — Encounter (HOSPITAL_COMMUNITY): Payer: Self-pay | Admitting: *Deleted

## 2017-11-18 ENCOUNTER — Other Ambulatory Visit: Payer: Self-pay | Admitting: Obstetrics and Gynecology

## 2017-11-18 ENCOUNTER — Telehealth (HOSPITAL_COMMUNITY): Payer: Self-pay | Admitting: *Deleted

## 2017-11-18 NOTE — Telephone Encounter (Signed)
Preadmission screen  

## 2017-11-19 ENCOUNTER — Inpatient Hospital Stay (HOSPITAL_COMMUNITY): Payer: 59 | Admitting: Anesthesiology

## 2017-11-19 ENCOUNTER — Encounter (HOSPITAL_COMMUNITY): Payer: Self-pay

## 2017-11-19 ENCOUNTER — Inpatient Hospital Stay (HOSPITAL_COMMUNITY)
Admission: AD | Admit: 2017-11-19 | Discharge: 2017-11-20 | DRG: 806 | Disposition: A | Discharge status: Home / Self Care | Payer: 59 | Source: Ambulatory Visit | Attending: Obstetrics and Gynecology | Admitting: Obstetrics and Gynecology

## 2017-11-19 ENCOUNTER — Other Ambulatory Visit: Payer: Self-pay

## 2017-11-19 DIAGNOSIS — Z3A38 38 weeks gestation of pregnancy: Secondary | ICD-10-CM | POA: Diagnosis not present

## 2017-11-19 DIAGNOSIS — O872 Hemorrhoids in the puerperium: Secondary | ICD-10-CM | POA: Diagnosis not present

## 2017-11-19 DIAGNOSIS — O365930 Maternal care for other known or suspected poor fetal growth, third trimester, not applicable or unspecified: Secondary | ICD-10-CM | POA: Diagnosis present

## 2017-11-19 DIAGNOSIS — O9081 Anemia of the puerperium: Secondary | ICD-10-CM | POA: Diagnosis not present

## 2017-11-19 DIAGNOSIS — D62 Acute posthemorrhagic anemia: Secondary | ICD-10-CM | POA: Diagnosis not present

## 2017-11-19 DIAGNOSIS — Z3490 Encounter for supervision of normal pregnancy, unspecified, unspecified trimester: Secondary | ICD-10-CM | POA: Diagnosis present

## 2017-11-19 LAB — CBC
HCT: 37.3 % (ref 36.0–46.0)
HEMOGLOBIN: 12.3 g/dL (ref 12.0–15.0)
MCH: 30.2 pg (ref 26.0–34.0)
MCHC: 33 g/dL (ref 30.0–36.0)
MCV: 91.6 fL (ref 78.0–100.0)
PLATELETS: 267 10*3/uL (ref 150–400)
RBC: 4.07 MIL/uL (ref 3.87–5.11)
RDW: 13.6 % (ref 11.5–15.5)
WBC: 4.6 10*3/uL (ref 4.0–10.5)

## 2017-11-19 LAB — TYPE AND SCREEN
ABO/RH(D): O POS
Antibody Screen: NEGATIVE

## 2017-11-19 LAB — RPR: RPR Ser Ql: NONREACTIVE

## 2017-11-19 MED ORDER — WITCH HAZEL-GLYCERIN EX PADS
1.0000 "application " | MEDICATED_PAD | CUTANEOUS | Status: DC | PRN
  Administered 2017-11-20: 1 via TOPICAL

## 2017-11-19 MED ORDER — IBUPROFEN 600 MG PO TABS
600.0000 mg | ORAL_TABLET | Freq: Four times a day (QID) | ORAL | Status: DC
  Administered 2017-11-19 – 2017-11-20 (×4): 600 mg via ORAL
  Filled 2017-11-19 (×4): qty 1

## 2017-11-19 MED ORDER — ZOLPIDEM TARTRATE 5 MG PO TABS: 5.0000 mg | ORAL_TABLET | Freq: Every evening | ORAL | Status: DC | PRN

## 2017-11-19 MED ORDER — ONDANSETRON HCL 4 MG/2ML IJ SOLN: 4.0000 mg | Freq: Four times a day (QID) | INTRAMUSCULAR | Status: DC | PRN

## 2017-11-19 MED ORDER — COCONUT OIL OIL: 1.0000 "application " | TOPICAL_OIL | Status: DC | PRN

## 2017-11-19 MED ORDER — PRENATAL MULTIVITAMIN CH
1.0000 | ORAL_TABLET | Freq: Every day | ORAL | Status: DC
  Administered 2017-11-20: 1 via ORAL
  Filled 2017-11-19: qty 1

## 2017-11-19 MED ORDER — LACTATED RINGERS IV SOLN
INTRAVENOUS | Status: DC
  Administered 2017-11-19 (×3): via INTRAVENOUS

## 2017-11-19 MED ORDER — SIMETHICONE 80 MG PO CHEW: 80.0000 mg | CHEWABLE_TABLET | ORAL | Status: DC | PRN

## 2017-11-19 MED ORDER — LIDOCAINE HCL (PF) 1 % IJ SOLN
INTRAMUSCULAR | Status: DC | PRN
  Administered 2017-11-19: 5 mL via EPIDURAL

## 2017-11-19 MED ORDER — METHYLERGONOVINE MALEATE 0.2 MG PO TABS: 0.2000 mg | ORAL_TABLET | ORAL | Status: DC | PRN

## 2017-11-19 MED ORDER — EPHEDRINE 5 MG/ML INJ
10.0000 mg | INTRAVENOUS | Status: DC | PRN
  Filled 2017-11-19: qty 2

## 2017-11-19 MED ORDER — ACETAMINOPHEN 325 MG PO TABS: 650.0000 mg | ORAL_TABLET | ORAL | Status: DC | PRN

## 2017-11-19 MED ORDER — DIBUCAINE 1 % RE OINT
1.0000 "application " | TOPICAL_OINTMENT | RECTAL | Status: DC | PRN
  Administered 2017-11-20: 1 via RECTAL
  Filled 2017-11-19: qty 28

## 2017-11-19 MED ORDER — LACTATED RINGERS IV SOLN
500.0000 mL | Freq: Once | INTRAVENOUS | Status: AC
  Administered 2017-11-19: 500 mL via INTRAVENOUS

## 2017-11-19 MED ORDER — ONDANSETRON HCL 4 MG/2ML IJ SOLN: 4.0000 mg | INTRAMUSCULAR | Status: DC | PRN

## 2017-11-19 MED ORDER — FENTANYL 2.5 MCG/ML BUPIVACAINE 1/10 % EPIDURAL INFUSION (WH - ANES)
14.0000 mL/h | INTRAMUSCULAR | Status: DC | PRN
  Administered 2017-11-19: 10 mL/h via EPIDURAL
  Filled 2017-11-19: qty 100

## 2017-11-19 MED ORDER — BENZOCAINE-MENTHOL 20-0.5 % EX AERO
1.0000 "application " | INHALATION_SPRAY | CUTANEOUS | Status: DC | PRN
  Administered 2017-11-19 – 2017-11-20 (×2): 1 via TOPICAL
  Filled 2017-11-19 (×2): qty 56

## 2017-11-19 MED ORDER — DIPHENHYDRAMINE HCL 25 MG PO CAPS: 25.0000 mg | ORAL_CAPSULE | Freq: Four times a day (QID) | ORAL | Status: DC | PRN

## 2017-11-19 MED ORDER — TETANUS-DIPHTH-ACELL PERTUSSIS 5-2.5-18.5 LF-MCG/0.5 IM SUSP: 0.5000 mL | Freq: Once | INTRAMUSCULAR | Status: DC

## 2017-11-19 MED ORDER — PHENYLEPHRINE 40 MCG/ML (10ML) SYRINGE FOR IV PUSH (FOR BLOOD PRESSURE SUPPORT)
80.0000 ug | PREFILLED_SYRINGE | INTRAVENOUS | Status: DC | PRN
  Administered 2017-11-19: 80 ug via INTRAVENOUS
  Filled 2017-11-19: qty 10
  Filled 2017-11-19: qty 5

## 2017-11-19 MED ORDER — SENNOSIDES-DOCUSATE SODIUM 8.6-50 MG PO TABS
2.0000 | ORAL_TABLET | ORAL | Status: DC
  Administered 2017-11-19: 2 via ORAL
  Filled 2017-11-19: qty 2

## 2017-11-19 MED ORDER — DIPHENHYDRAMINE HCL 50 MG/ML IJ SOLN: 12.5000 mg | INTRAMUSCULAR | Status: DC | PRN

## 2017-11-19 MED ORDER — ACETAMINOPHEN 325 MG PO TABS
650.0000 mg | ORAL_TABLET | ORAL | Status: DC | PRN
  Administered 2017-11-19: 650 mg via ORAL
  Filled 2017-11-19: qty 2

## 2017-11-19 MED ORDER — LIDOCAINE HCL (PF) 1 % IJ SOLN
INTRAMUSCULAR | Status: AC
  Filled 2017-11-19: qty 30

## 2017-11-19 MED ORDER — METHYLERGONOVINE MALEATE 0.2 MG/ML IJ SOLN: 0.2000 mg | INTRAMUSCULAR | Status: DC | PRN

## 2017-11-19 MED ORDER — PHENYLEPHRINE 40 MCG/ML (10ML) SYRINGE FOR IV PUSH (FOR BLOOD PRESSURE SUPPORT)
80.0000 ug | PREFILLED_SYRINGE | INTRAVENOUS | Status: AC | PRN
  Administered 2017-11-19 (×3): 80 ug via INTRAVENOUS

## 2017-11-19 MED ORDER — SOD CITRATE-CITRIC ACID 500-334 MG/5ML PO SOLN: 30.0000 mL | ORAL | Status: DC | PRN

## 2017-11-19 MED ORDER — OXYCODONE-ACETAMINOPHEN 5-325 MG PO TABS: 1.0000 | ORAL_TABLET | ORAL | Status: DC | PRN

## 2017-11-19 MED ORDER — OXYCODONE-ACETAMINOPHEN 5-325 MG PO TABS: 2.0000 | ORAL_TABLET | ORAL | Status: DC | PRN

## 2017-11-19 MED ORDER — OXYTOCIN 40 UNITS IN LACTATED RINGERS INFUSION - SIMPLE MED
1.0000 m[IU]/min | INTRAVENOUS | Status: DC
  Administered 2017-11-19: 666 m[IU]/min via INTRAVENOUS
  Administered 2017-11-19: 4 m[IU]/min via INTRAVENOUS
  Administered 2017-11-19: 10 m[IU]/min via INTRAVENOUS
  Administered 2017-11-19: 2 m[IU]/min via INTRAVENOUS
  Administered 2017-11-19: 8 m[IU]/min via INTRAVENOUS
  Administered 2017-11-19: 6 m[IU]/min via INTRAVENOUS
  Filled 2017-11-19: qty 1000

## 2017-11-19 MED ORDER — LACTATED RINGERS IV SOLN: 500.0000 mL | INTRAVENOUS | Status: DC | PRN

## 2017-11-19 MED ORDER — LACTATED RINGERS IV SOLN: 500.0000 mL | Freq: Once | INTRAVENOUS | Status: DC

## 2017-11-19 MED ORDER — TERBUTALINE SULFATE 1 MG/ML IJ SOLN
0.2500 mg | Freq: Once | INTRAMUSCULAR | Status: DC | PRN
  Filled 2017-11-19: qty 1

## 2017-11-19 MED ORDER — ONDANSETRON HCL 4 MG PO TABS: 4.0000 mg | ORAL_TABLET | ORAL | Status: DC | PRN

## 2017-11-19 NOTE — Anesthesia Procedure Notes (Signed)
Epidural Patient location during procedure: OB Start time: 11/19/2017 1:50 PM End time: 11/19/2017 2:02 PM  Staffing Anesthesiologist: Achille Rich, MD Performed: anesthesiologist   Preanesthetic Checklist Completed: patient identified, site marked, pre-op evaluation, timeout performed, IV checked, risks and benefits discussed and monitors and equipment checked  Epidural Patient position: sitting Prep: DuraPrep Patient monitoring: heart rate, cardiac monitor, continuous pulse ox and blood pressure Approach: midline Location: L2-L3 Injection technique: LOR saline  Needle:  Needle type: Tuohy  Needle gauge: 17 G Needle length: 9 cm Needle insertion depth: 4 cm Catheter type: closed end flexible Catheter size: 19 Gauge Catheter at skin depth: 11 cm Test dose: negative and Other  Assessment Events: blood not aspirated, injection not painful, no injection resistance and negative IV test  Additional Notes Informed consent obtained prior to proceeding including risk of failure, 1% risk of PDPH, risk of minor discomfort and bruising.  Discussed rare but serious complications including epidural abscess, permanent nerve injury, epidural hematoma.  Discussed alternatives to epidural analgesia and patient desires to proceed.  Timeout performed pre-procedure verifying patient name, procedure, and platelet count.  Patient tolerated procedure well. Reason for block:procedure for pain

## 2017-11-19 NOTE — Anesthesia Pain Management Evaluation Note (Signed)
  CRNA Pain Management Visit Note  Patient: Diane [name], 32 y.o., female  "Hello I am a member of the anesthesia team at [facility]. We have an anesthesia team available at all times to provide care throughout the hospital, including epidural management and anesthesia for C-section. I don't know your plan for the delivery whether it a natural birth, water birth, IV sedation, nitrous supplementation, doula or epidural, but we want to meet your pain goals."   1.Was your pain managed to your expectations on prior hospitalizations?   Yes   2.What is your expectation for pain management during this hospitalization?     Epidural  3.How can we help you reach that goal? Be on call   Record the patient's initial score and the patient's pain goal.   Pain: 0  Pain Goal: 7  The [facility] wants you to be able to say your pain was always managed very well.  Jennelle Human 11/19/2017

## 2017-11-19 NOTE — H&P (Signed)
Diane [name] is a 32 y.o. female presenting for IOL for IUGR. OB History    Gravida  2   Para  1   Term  1   Preterm      AB      Living  1     SAB      TAB      Ectopic      Multiple  0   Live Births  1          Past Medical History:  Diagnosis Date  . Anemia    AFTER DELIVERY  . History of UTI   . Vaginal Pap smear, abnormal    2015   Past Surgical History:  Procedure Laterality Date  . APPENDECTOMY  05/06/2015   laproscopic  . LAPAROSCOPIC APPENDECTOMY N/A 05/06/2015   Procedure: APPENDECTOMY LAPAROSCOPIC;  Surgeon: Violeta Gelinas, MD;  Location: [facility] OR;  Service: General;  Laterality: N/A;  . NO PAST SURGERIES    . PERINEAL LACERATION REPAIR N/A 06/12/2016   Procedure: Revision/REPAIR of Postpartum PERINEAL Dehiscence;  Surgeon: [name], MD;  Location: [facility] ORS;  Service: Gynecology;  Laterality: N/A;   Family History: family history includes Cancer in her maternal grandmother, mother, and paternal grandmother; Endometriosis in her mother; Heart disease in her maternal grandfather; Hypertension in her maternal grandmother; Miscarriages / [place] in her mother; Stroke in her paternal grandmother. Social History:  reports that she has never smoked. She has never used smokeless tobacco. She reports that she drinks alcohol. She reports that she does not use drugs.     Maternal Diabetes: No Genetic Screening: Normal Maternal Ultrasounds/Referrals: Normal Fetal Ultrasounds or other Referrals:  None-IUGR Maternal Substance Abuse:  No Significant Maternal Medications:  None Significant Maternal Lab Results:  None Other Comments:  None  Review of Systems  Constitutional: Negative.   All other systems reviewed and are negative.  Maternal Medical History:  Fetal activity: Perceived fetal activity is normal.   Last perceived fetal movement was within the past hour.    Prenatal complications: IUGR.   Prenatal Complications - Diabetes:  none.    Dilation: 10 Effacement (%): 100 Station: Plus 1 Exam by:: Mary [place] Johnson Blood pressure 101/61, pulse 75, temperature (!) 97.4 F (36.3 C), temperature source Oral, resp. rate 17, height  (1.575 m), weight 60.2 kg (132 lb 12.8 oz), SpO2 99 %, currently breastfeeding. Maternal Exam:  Uterine Assessment: Contraction strength is mild.  Contraction frequency is rare.   Abdomen: Patient reports no abdominal tenderness. Fetal presentation: vertex  Introitus: Normal vulva. Normal vagina.  Ferning test: not done.  Nitrazine test: not done. Amniotic fluid character: not assessed.  Pelvis: adequate for delivery.      Physical Exam  Nursing note and vitals reviewed. Constitutional: She is oriented to person, place, and time. She appears well-developed and well-nourished.  HENT:  Head: Normocephalic and atraumatic.  Neck: Normal range of motion. Neck supple.  Cardiovascular: Normal rate and regular rhythm.  Respiratory: Effort normal and breath sounds normal.  GI: Soft. Bowel sounds are normal.  Genitourinary: Vagina normal and uterus normal.  Musculoskeletal: Normal range of motion.  Neurological: She is alert and oriented to person, place, and time. She has normal reflexes.  Skin: Skin is warm and dry.  Psychiatric: She has a normal mood and affect.    Prenatal labs: ABO, Rh: --/--/O POS (05/17 0825) Antibody: NEG (05/17 0825) Rubella: Immune (10/29 0000) RPR: Non Reactive (05/17 0825)  HBsAg: Negative (10/29 0000)  HIV: Non-reactive (10/29 0000)  GBS: Negative (04/24 0000)   Assessment/Plan: IUGR-isolated 38+ weeks IOL   Diane [name] J 11/19/2017, 4:04 PM

## 2017-11-19 NOTE — Anesthesia Preprocedure Evaluation (Signed)
Anesthesia Evaluation  Patient identified by MRN, date of birth, ID band Patient awake    Reviewed: Allergy & Precautions, H&P , NPO status , Patient's Chart, lab work & pertinent test results  Airway Mallampati: II   Neck ROM: full    Dental   Pulmonary neg pulmonary ROS,    breath sounds clear to auscultation       Cardiovascular negative cardio ROS   Rhythm:regular Rate:Normal     Neuro/Psych    GI/Hepatic   Endo/Other    Renal/GU      Musculoskeletal   Abdominal   Peds  Hematology   Anesthesia Other Findings   Reproductive/Obstetrics (+) Pregnancy                             Anesthesia Physical Anesthesia Plan  ASA: II  Anesthesia Plan: Epidural   Post-op Pain Management:    Induction: Intravenous  PONV Risk Score and Plan: 2 and Treatment may vary due to age or medical condition  Airway Management Planned: Natural Airway  Additional Equipment:   Intra-op Plan:   Post-operative Plan:   Informed Consent: I have reviewed the patients History and Physical, chart, labs and discussed the procedure including the risks, benefits and alternatives for the proposed anesthesia with the patient or authorized representative who has indicated his/her understanding and acceptance.       Plan Discussed with: Anesthesiologist  Anesthesia Plan Comments:         Anesthesia Quick Evaluation  

## 2017-11-20 LAB — CBC
HEMATOCRIT: 27.9 % — AB (ref 36.0–46.0)
HEMOGLOBIN: 9 g/dL — AB (ref 12.0–15.0)
MCH: 29.5 pg (ref 26.0–34.0)
MCHC: 32.3 g/dL (ref 30.0–36.0)
MCV: 91.5 fL (ref 78.0–100.0)
Platelets: 210 10*3/uL (ref 150–400)
RBC: 3.05 MIL/uL — AB (ref 3.87–5.11)
RDW: 13.7 % (ref 11.5–15.5)
WBC: 6.2 10*3/uL (ref 4.0–10.5)

## 2017-11-20 MED ORDER — DIBUCAINE 1 % RE OINT: 1.0000 "application " | TOPICAL_OINTMENT | RECTAL | 0 refills | Status: DC | PRN

## 2017-11-20 MED ORDER — WITCH HAZEL-GLYCERIN EX PADS: 1.0000 "application " | MEDICATED_PAD | CUTANEOUS | 12 refills | Status: DC | PRN

## 2017-11-20 MED ORDER — ACETAMINOPHEN 325 MG PO TABS: 650.0000 mg | ORAL_TABLET | ORAL | Status: DC | PRN

## 2017-11-20 MED ORDER — COCONUT OIL OIL: 1.0000 "application " | TOPICAL_OIL | 0 refills | Status: DC | PRN

## 2017-11-20 MED ORDER — BENZOCAINE-MENTHOL 20-0.5 % EX AERO: 1.0000 "application " | INHALATION_SPRAY | CUTANEOUS | Status: DC | PRN

## 2017-11-20 MED ORDER — IBUPROFEN 600 MG PO TABS: 600.0000 mg | ORAL_TABLET | Freq: Four times a day (QID) | ORAL | 0 refills | Status: DC

## 2017-11-20 NOTE — Anesthesia Postprocedure Evaluation (Signed)
Anesthesia Post Note  Patient: Diane [name]  Procedure(s) Performed: AN AD HOC LABOR EPIDURAL     Patient location during evaluation: Mother Baby Anesthesia Type: Epidural Level of consciousness: awake and alert Pain management: pain level controlled Vital Signs Assessment: post-procedure vital signs reviewed and stable Respiratory status: spontaneous breathing, nonlabored ventilation and respiratory function stable Cardiovascular status: stable Postop Assessment: no headache, no backache and epidural receding Anesthetic complications: no    Last Vitals:  Vitals:   11/19/17 2348 11/20/17 0547  BP: (!) 86/56 (!) 89/57  Pulse: 68 66  Resp: 18 18  Temp: (!) 36.4 C 36.7 C  SpO2:      Last Pain:  Vitals:   11/20/17 0822  TempSrc:   PainSc: 0-No pain   Pain Goal: Patients Stated Pain Goal: 0 (11/19/17 1635)               Iden Stripling S

## 2017-11-20 NOTE — Progress Notes (Signed)
PPD # 1 SVD Information for the patient's newborn:  Jalicia, Roszak [[id number]]  female      breast feeding  Baby name: Alyson Ingles:  Reports feeling well, desires early DC             Tolerating po/ No nausea or vomiting             Bleeding is light             Pain controlled with ibuprofen (OTC)             Up ad lib / ambulatory / voiding without difficulties        O:  A & O x 3, in no apparent distress              VS:  Vitals:   11/19/17 1755 11/19/17 2003 11/19/17 2348 11/20/17 0547  BP: 100/67 (!) 97/59 (!) 86/56 (!) 89/57  Pulse: 70 79 68 66  Resp:  Temp: 98.4 F (36.9 C) 97.6 F (36.4 C) (!) 97.5 F (36.4 C) 98 F (36.7 C)  TempSrc: Oral Oral Oral Oral  SpO2: 99%     Weight:      Height:        LABS:  Recent Labs    11/19/17 0825 11/20/17 0518  WBC 4.6 6.2  HGB 12.3 9.0*  HCT 37.3 27.9*  PLT 267 210    Blood type: --/--/O POS (05/17 0825)  Rubella: Immune (10/29 0000)   I&O: I/O last 3 completed shifts: In: -  Out: 200 [Blood:200]          No intake/output data recorded.  Lungs: Clear and unlabored  Heart: regular rate and rhythm / no murmurs  Abdomen: soft, non-tender, non-distended             Fundus: firm, non-tender, U-1  Perineum: intact, small indurated hemorrhoids noted  Lochia: small  Extremities: no edema, no calf pain or tenderness    A/P: PPD # 1 32 y.o., [id number]   Principal Problem:   SVD 5/17 Active Problems:   Postpartum care following vaginal delivery   Acute blood loss anemia  - started oral Fe and Mag Ox   Encounter for planned induction of labor - IUGR, isolated Hemorrhoids - inflamed  - start Anusol BID x 10 days  Doing well - stable status  Routine post partum orders  DC home today w/ instructions  F/U at [facility] in 6 weeks and PRN     Neta Mends, MSN, CNM 11/20/2017, 10:07 AM

## 2017-11-20 NOTE — Discharge Summary (Signed)
Obstetric Discharge Summary Reason for Admission: induction of labor and IUGR Prenatal Procedures: NST and ultrasound Intrapartum Procedures: spontaneous vaginal delivery and epidural Postpartum Procedures: none Complications-Operative and Postpartum: none Hemoglobin  Date Value Ref Range Status  11/20/2017 9.0 (L) 12.0 - 15.0 g/dL Final    Comment:    DELTA CHECK NOTED REPEATED TO VERIFY    HCT  Date Value Ref Range Status  11/20/2017 27.9 (L) 36.0 - 46.0 % Final    Physical Exam:  General: alert, cooperative and no distress Lochia: appropriate Uterine Fundus: firm Incision: na DVT Evaluation: No cords or calf tenderness. No significant calf/ankle edema.  Discharge Diagnoses: Term Pregnancy-delivered and anemia, hemorrhoids  Discharge Information: Date: 11/20/2017 Activity: pelvic rest Diet: routine Medications:  Allergies as of 11/20/2017   No Known Allergies     Medication List    TAKE these medications   acetaminophen 325 MG tablet Commonly known as:  TYLENOL Take 2 tablets (650 mg total) by mouth every 4 (four) hours as needed (for pain scale < 4).   benzocaine-Menthol 20-0.5 % Aero Commonly known as:  DERMOPLAST Apply 1 application topically as needed for irritation (perineal discomfort).   coconut oil Oil Apply 1 application topically as needed.   dibucaine 1 % Oint Commonly known as:  NUPERCAINAL Place 1 application rectally as needed for hemorrhoids.   ibuprofen 600 MG tablet Commonly known as:  ADVIL,MOTRIN Take 1 tablet (600 mg total) by mouth every 6 (six) hours.   prenatal multivitamin Tabs tablet Take 1 tablet by mouth daily at 12 noon.   witch hazel-glycerin pad Commonly known as:  TUCKS Apply 1 application topically as needed for hemorrhoids.            Discharge Care Instructions  (From admission, onward)        Start     Ordered   11/20/17 0000  Discharge wound care:    Comments:  Sitz baths 2 times /day with warm water x  1 week   11/20/17 1018     Condition: stable Instructions: refer to practice specific booklet Discharge to: home Follow-up Information    Diane Mackie, MD. Schedule an appointment as soon as possible for a visit in 6 week(s).   Specialty:  Obstetrics and Gynecology Contact information: [address] [place] [address] [phone]           Newborn Data: Live born female Diane [name] Birth Weight: 5 lb 13.3 oz (2645 g) APGAR: 8, 9  Newborn Delivery   Birth date/time:  11/19/2017 15:41:00 Delivery type:  Vaginal, Spontaneous     Home with mother.  Diane [name], CNM 11/20/2017, 10:18 AM

## 2017-11-20 NOTE — Addendum Note (Signed)
Addendum  created 11/20/17 0916 by Algis Greenhouse, CRNA   Charge Capture section accepted, Visit diagnoses modified

## 2017-11-20 NOTE — Addendum Note (Signed)
Addendum  created 11/20/17 0916 by Algis Greenhouse, CRNA   Sign clinical note

## 2017-11-20 NOTE — Lactation Note (Signed)
This note was copied from a baby's chart. Lactation Consultation Note  Patient Name: Diane [name] [id number] Date: 11/20/2017 Reason for consult: Initial assessment;Early term 37-38.6wks;Infant < 6lbs;Other (Comment)(IUGR)  10 hours old early term < 6 lbs female who is being exclusively BF by her mother; she's a P2 and experienced BF, she was able to BF her older child for 5 months. Baby was swaddled in mother's arm when entering the room, offered latch assistance but mom politely declined stating that baby just fed. Per mom feedings at the breast are comfortable; but she's having some sensitivity at the beginning of the feeding; she's probably experiencing transient soreness. Instructed mom to break the latch and re-latch baby is the feedings become too uncomfortable, discussed tips for a deep asymmetrical latch. Mom has not heard swallows yet during feedings, she voiced she has not paid attention but she will on posterior feedings.  Mom already knows how to hand express, discussed options if supplementation were to be necessary since baby is < 6 pounds, early term and has been affected with IUGR. Offered to set up a DEBP but mom politely declined, she'd rather "wait and see" how baby is feeding for the next day before deciding if she wants to start pumping while in the hospital. She does have a [employer] DEBP Pump & Style at home.  Encouraged mom to feed STS baby 8-12 times/24 hours or sooner if feeding cues are present. If baby is not cueing in a 3 hour period, mom will take baby STS to the breast to give her an opportunity to feed. Discussed BF brochure, BF resources and feeding diary, mom is aware of LC services and will call PRN.    Maternal Data Formula Feeding for Exclusion: No Has patient been taught Hand Expression?: Yes Does the patient have breastfeeding experience prior to this delivery?: Yes  Feeding Feeding Type: Breast Fed Length of feed: 20 min  LATCH Score Latch: Repeated  attempts needed to sustain latch, nipple held in mouth throughout feeding, stimulation needed to elicit sucking reflex.  Audible Swallowing: None  Type of Nipple: Flat(short shaft)  Comfort (Breast/Nipple): Soft / non-tender  Hold (Positioning): No assistance needed to correctly position infant at breast.  LATCH Score: 6  Interventions Interventions: Breast feeding basics reviewed  Lactation Tools Discussed/Used     Consult Status Consult Status: Follow-up Date: 11/20/17 Follow-up type: In-patient    Diane [name] 11/20/2017, 1:41 AM

## 2017-11-20 NOTE — Anesthesia Postprocedure Evaluation (Signed)
Anesthesia Post Note  Patient: Diane [name]  Procedure(s) Performed: AN AD HOC LABOR EPIDURAL     Patient location during evaluation: Mother Baby Anesthesia Type: Epidural Level of consciousness: awake Pain management: satisfactory to patient Vital Signs Assessment: post-procedure vital signs reviewed and stable Respiratory status: spontaneous breathing Cardiovascular status: stable Anesthetic complications: no    Last Vitals:  Vitals:   11/19/17 2348 11/20/17 0547  BP: (!) 86/56 (!) 89/57  Pulse: 68 66  Resp: 18 18  Temp: (!) 36.4 C 36.7 C  SpO2:      Last Pain:  Vitals:   11/20/17 0822  TempSrc:   PainSc: 0-No pain   Pain Goal: Patients Stated Pain Goal: 0 (11/19/17 1635)               Cephus Shelling

## 2017-11-30 ENCOUNTER — Inpatient Hospital Stay (HOSPITAL_COMMUNITY): Admit: 2017-11-30 | Payer: Self-pay

## 2019-05-31 LAB — OB RESULTS CONSOLE ABO/RH: RH Type: POSITIVE

## 2019-05-31 LAB — OB RESULTS CONSOLE HEPATITIS B SURFACE ANTIGEN: Hepatitis B Surface Ag: NEGATIVE

## 2019-05-31 LAB — OB RESULTS CONSOLE ANTIBODY SCREEN: Antibody Screen: NEGATIVE

## 2019-05-31 LAB — OB RESULTS CONSOLE RUBELLA ANTIBODY, IGM: Rubella: UNDETERMINED

## 2019-05-31 LAB — OB RESULTS CONSOLE HIV ANTIBODY (ROUTINE TESTING): HIV: NONREACTIVE

## 2019-05-31 LAB — OB RESULTS CONSOLE RPR: RPR: NONREACTIVE

## 2019-07-07 NOTE — L&D Delivery Note (Signed)
Delivery Note At 12:47 PM a viable and healthy female was delivered via Vaginal, Spontaneous (Presentation: Left Occiput Anterior).  APGAR: 9, 9; weight 6 lb 11.2 oz (3040 g).   Placenta status: Spontaneous, Intact.  Cord: 3 vessels with the following complications: None.  Cord pH: na  Anesthesia: Epidural Episiotomy: None Lacerations: None Suture Repair: na Est. Blood Loss (mL): 103  Mom to postpartum.  Baby to Couplet care / Skin to Skin.  Diane [name] J 12/27/2019, 4:23 PM

## 2019-10-13 ENCOUNTER — Ambulatory Visit: Payer: 59 | Attending: Internal Medicine

## 2019-10-13 DIAGNOSIS — Z23 Encounter for immunization: Secondary | ICD-10-CM

## 2019-10-13 NOTE — Progress Notes (Signed)
   Covid-19 Vaccination Clinic  Name:  Pascale Maves    MRN: [id number] DOB: [birth year]  10/13/2019  Ms. Goldin was observed post Covid-19 immunization for 15 minutes without incident. She was provided with Vaccine Information Sheet and instruction to access the V-Safe system.   Ms. Jaskolski was instructed to call 911 with any severe reactions post vaccine: Marland Kitchen Difficulty breathing  . Swelling of face and throat  . A fast heartbeat  . A bad rash all over body  . Dizziness and weakness   Immunizations Administered    Name Date Dose VIS Date Route   Pfizer COVID-19 Vaccine 10/13/2019  4:13 PM 0.3 mL 06/16/2019 Intramuscular   Manufacturer: [employer], [employer]   Lot: [id number]   NDC: [address]-5

## 2019-11-06 ENCOUNTER — Ambulatory Visit: Payer: 59 | Attending: Internal Medicine

## 2019-11-06 DIAGNOSIS — Z23 Encounter for immunization: Secondary | ICD-10-CM

## 2019-11-06 NOTE — Progress Notes (Signed)
   Covid-19 Vaccination Clinic  Name:  [name]    MRN: [id number] DOB: 12-26-1985  11/06/2019  [name] was observed post Covid-19 immunization for 15 minutes without incident. She was provided with Vaccine Information Sheet and instruction to access the V-Safe system.   [name] was instructed to call 911 with any severe reactions post vaccine: Marland Kitchen Difficulty breathing  . Swelling of face and throat  . A fast heartbeat  . A bad rash all over body  . Dizziness and weakness   Immunizations Administered    Name Date Dose VIS Date Route   Pfizer COVID-19 Vaccine 11/06/2019 11:09 AM 0.3 mL 08/30/2018 Intramuscular   Manufacturer: [employer], [employer]   Lot: [id number]   NDC: [address]-8

## 2019-12-19 ENCOUNTER — Encounter (HOSPITAL_COMMUNITY): Payer: Self-pay | Admitting: *Deleted

## 2019-12-19 ENCOUNTER — Telehealth (HOSPITAL_COMMUNITY): Payer: Self-pay | Admitting: *Deleted

## 2019-12-19 LAB — OB RESULTS CONSOLE GBS: GBS: POSITIVE

## 2019-12-19 NOTE — Telephone Encounter (Signed)
Preadmission screen  

## 2019-12-21 ENCOUNTER — Telehealth (HOSPITAL_COMMUNITY): Payer: Self-pay | Admitting: *Deleted

## 2019-12-21 NOTE — Telephone Encounter (Signed)
Preadmission screen  

## 2019-12-22 ENCOUNTER — Telehealth (HOSPITAL_COMMUNITY): Payer: Self-pay | Admitting: *Deleted

## 2019-12-22 ENCOUNTER — Other Ambulatory Visit: Payer: Self-pay | Admitting: Obstetrics and Gynecology

## 2019-12-22 ENCOUNTER — Encounter (HOSPITAL_COMMUNITY): Payer: Self-pay | Admitting: *Deleted

## 2019-12-22 NOTE — Telephone Encounter (Signed)
Preadmission screen  

## 2019-12-25 ENCOUNTER — Other Ambulatory Visit (HOSPITAL_COMMUNITY)
Admission: RE | Admit: 2019-12-25 | Discharge: 2019-12-25 | Disposition: A | Discharge status: Home / Self Care | Payer: 59 | Source: Ambulatory Visit | Attending: Obstetrics and Gynecology | Admitting: Obstetrics and Gynecology

## 2019-12-25 LAB — SARS CORONAVIRUS 2 (TAT 6-24 HRS): SARS Coronavirus 2: NEGATIVE

## 2019-12-27 ENCOUNTER — Inpatient Hospital Stay (HOSPITAL_COMMUNITY): Payer: 59 | Admitting: Anesthesiology

## 2019-12-27 ENCOUNTER — Encounter (HOSPITAL_COMMUNITY): Payer: Self-pay | Admitting: Obstetrics and Gynecology

## 2019-12-27 ENCOUNTER — Other Ambulatory Visit: Payer: Self-pay

## 2019-12-27 ENCOUNTER — Inpatient Hospital Stay (HOSPITAL_COMMUNITY): Payer: 59

## 2019-12-27 ENCOUNTER — Inpatient Hospital Stay (HOSPITAL_COMMUNITY)
Admission: RE | Admit: 2019-12-27 | Discharge: 2019-12-28 | DRG: 806 | Disposition: A | Discharge status: Home / Self Care | Payer: 59 | Attending: Obstetrics and Gynecology | Admitting: Obstetrics and Gynecology

## 2019-12-27 DIAGNOSIS — Z3A39 39 weeks gestation of pregnancy: Secondary | ICD-10-CM | POA: Diagnosis not present

## 2019-12-27 DIAGNOSIS — O365930 Maternal care for other known or suspected poor fetal growth, third trimester, not applicable or unspecified: Secondary | ICD-10-CM | POA: Diagnosis present

## 2019-12-27 DIAGNOSIS — O9902 Anemia complicating childbirth: Secondary | ICD-10-CM | POA: Diagnosis present

## 2019-12-27 DIAGNOSIS — D62 Acute posthemorrhagic anemia: Secondary | ICD-10-CM | POA: Diagnosis not present

## 2019-12-27 DIAGNOSIS — O99824 Streptococcus B carrier state complicating childbirth: Secondary | ICD-10-CM | POA: Diagnosis present

## 2019-12-27 DIAGNOSIS — Z20822 Contact with and (suspected) exposure to covid-19: Secondary | ICD-10-CM | POA: Diagnosis present

## 2019-12-27 DIAGNOSIS — O9081 Anemia of the puerperium: Secondary | ICD-10-CM | POA: Diagnosis not present

## 2019-12-27 DIAGNOSIS — Z3490 Encounter for supervision of normal pregnancy, unspecified, unspecified trimester: Secondary | ICD-10-CM | POA: Diagnosis present

## 2019-12-27 LAB — CBC
HCT: 34.6 % — ABNORMAL LOW (ref 36.0–46.0)
Hemoglobin: 10.9 g/dL — ABNORMAL LOW (ref 12.0–15.0)
MCH: 28.8 pg (ref 26.0–34.0)
MCHC: 31.5 g/dL (ref 30.0–36.0)
MCV: 91.5 fL (ref 80.0–100.0)
Platelets: 252 10*3/uL (ref 150–400)
RBC: 3.78 MIL/uL — ABNORMAL LOW (ref 3.87–5.11)
RDW: 12.9 % (ref 11.5–15.5)
WBC: 6.6 10*3/uL (ref 4.0–10.5)
nRBC: 0 % (ref 0.0–0.2)

## 2019-12-27 LAB — TYPE AND SCREEN
ABO/RH(D): O POS
Antibody Screen: NEGATIVE

## 2019-12-27 LAB — ABO/RH: ABO/RH(D): O POS

## 2019-12-27 LAB — RPR: RPR Ser Ql: NONREACTIVE

## 2019-12-27 MED ORDER — SENNOSIDES-DOCUSATE SODIUM 8.6-50 MG PO TABS
2.0000 | ORAL_TABLET | ORAL | Status: DC
  Administered 2019-12-28: 2 via ORAL
  Filled 2019-12-27: qty 2

## 2019-12-27 MED ORDER — METHYLERGONOVINE MALEATE 0.2 MG PO TABS: 0.2000 mg | ORAL_TABLET | ORAL | Status: DC | PRN

## 2019-12-27 MED ORDER — DIBUCAINE (PERIANAL) 1 % EX OINT: 1.0000 "application " | TOPICAL_OINTMENT | CUTANEOUS | Status: DC | PRN

## 2019-12-27 MED ORDER — ACETAMINOPHEN 325 MG PO TABS: 650.0000 mg | ORAL_TABLET | ORAL | Status: DC | PRN

## 2019-12-27 MED ORDER — LACTATED RINGERS IV SOLN: 500.0000 mL | INTRAVENOUS | Status: DC | PRN

## 2019-12-27 MED ORDER — COCONUT OIL OIL: 1.0000 "application " | TOPICAL_OIL | Status: DC | PRN

## 2019-12-27 MED ORDER — EPHEDRINE 5 MG/ML INJ: 10.0000 mg | INTRAVENOUS | Status: DC | PRN

## 2019-12-27 MED ORDER — TETANUS-DIPHTH-ACELL PERTUSSIS 5-2.5-18.5 LF-MCG/0.5 IM SUSP: 0.5000 mL | Freq: Once | INTRAMUSCULAR | Status: DC

## 2019-12-27 MED ORDER — OXYTOCIN BOLUS FROM INFUSION
333.0000 mL | Freq: Once | INTRAVENOUS | Status: AC
  Administered 2019-12-27: 333 mL via INTRAVENOUS

## 2019-12-27 MED ORDER — SODIUM CHLORIDE (PF) 0.9 % IJ SOLN
INTRAMUSCULAR | Status: DC | PRN
  Administered 2019-12-27: 12 mL/h via EPIDURAL

## 2019-12-27 MED ORDER — SODIUM CHLORIDE 0.9 % IV SOLN
5.0000 10*6.[IU] | Freq: Once | INTRAVENOUS | Status: AC
  Administered 2019-12-27: 5 10*6.[IU] via INTRAVENOUS
  Filled 2019-12-27: qty 5

## 2019-12-27 MED ORDER — DIPHENHYDRAMINE HCL 25 MG PO CAPS: 25.0000 mg | ORAL_CAPSULE | Freq: Four times a day (QID) | ORAL | Status: DC | PRN

## 2019-12-27 MED ORDER — BENZOCAINE-MENTHOL 20-0.5 % EX AERO: 1.0000 "application " | INHALATION_SPRAY | CUTANEOUS | Status: DC | PRN

## 2019-12-27 MED ORDER — ONDANSETRON HCL 4 MG/2ML IJ SOLN: 4.0000 mg | Freq: Four times a day (QID) | INTRAMUSCULAR | Status: DC | PRN

## 2019-12-27 MED ORDER — SOD CITRATE-CITRIC ACID 500-334 MG/5ML PO SOLN: 30.0000 mL | ORAL | Status: DC | PRN

## 2019-12-27 MED ORDER — IBUPROFEN 600 MG PO TABS
600.0000 mg | ORAL_TABLET | Freq: Four times a day (QID) | ORAL | Status: DC
  Administered 2019-12-27 – 2019-12-28 (×4): 600 mg via ORAL
  Filled 2019-12-27 (×3): qty 1

## 2019-12-27 MED ORDER — LIDOCAINE HCL (PF) 1 % IJ SOLN
INTRAMUSCULAR | Status: DC | PRN
  Administered 2019-12-27: 11 mL via EPIDURAL

## 2019-12-27 MED ORDER — LACTATED RINGERS IV SOLN: 500.0000 mL | Freq: Once | INTRAVENOUS | Status: DC

## 2019-12-27 MED ORDER — PHENYLEPHRINE 40 MCG/ML (10ML) SYRINGE FOR IV PUSH (FOR BLOOD PRESSURE SUPPORT)
80.0000 ug | PREFILLED_SYRINGE | INTRAVENOUS | Status: DC | PRN
  Filled 2019-12-27: qty 10

## 2019-12-27 MED ORDER — ZOLPIDEM TARTRATE 5 MG PO TABS: 5.0000 mg | ORAL_TABLET | Freq: Every evening | ORAL | Status: DC | PRN

## 2019-12-27 MED ORDER — OXYTOCIN-SODIUM CHLORIDE 30-0.9 UT/500ML-% IV SOLN
1.0000 m[IU]/min | INTRAVENOUS | Status: DC
  Administered 2019-12-27: 2 m[IU]/min via INTRAVENOUS
  Filled 2019-12-27: qty 500

## 2019-12-27 MED ORDER — OXYTOCIN-SODIUM CHLORIDE 30-0.9 UT/500ML-% IV SOLN: 2.5000 [IU]/h | INTRAVENOUS | Status: DC

## 2019-12-27 MED ORDER — TERBUTALINE SULFATE 1 MG/ML IJ SOLN: 0.2500 mg | Freq: Once | INTRAMUSCULAR | Status: DC | PRN

## 2019-12-27 MED ORDER — METHYLERGONOVINE MALEATE 0.2 MG/ML IJ SOLN: 0.2000 mg | INTRAMUSCULAR | Status: DC | PRN

## 2019-12-27 MED ORDER — PHENYLEPHRINE 40 MCG/ML (10ML) SYRINGE FOR IV PUSH (FOR BLOOD PRESSURE SUPPORT)
80.0000 ug | PREFILLED_SYRINGE | INTRAVENOUS | Status: DC | PRN
  Administered 2019-12-27 (×2): 80 ug via INTRAVENOUS

## 2019-12-27 MED ORDER — ACETAMINOPHEN 325 MG PO TABS
650.0000 mg | ORAL_TABLET | ORAL | Status: DC | PRN
  Administered 2019-12-27 – 2019-12-28 (×2): 650 mg via ORAL
  Filled 2019-12-27 (×2): qty 2

## 2019-12-27 MED ORDER — LACTATED RINGERS IV SOLN: INTRAVENOUS | Status: DC

## 2019-12-27 MED ORDER — WITCH HAZEL-GLYCERIN EX PADS: 1.0000 "application " | MEDICATED_PAD | CUTANEOUS | Status: DC | PRN

## 2019-12-27 MED ORDER — PRENATAL MULTIVITAMIN CH
1.0000 | ORAL_TABLET | Freq: Every day | ORAL | Status: DC
  Administered 2019-12-28: 1 via ORAL
  Filled 2019-12-27: qty 1

## 2019-12-27 MED ORDER — ONDANSETRON HCL 4 MG PO TABS: 4.0000 mg | ORAL_TABLET | ORAL | Status: DC | PRN

## 2019-12-27 MED ORDER — LIDOCAINE HCL (PF) 1 % IJ SOLN: 30.0000 mL | INTRAMUSCULAR | Status: DC | PRN

## 2019-12-27 MED ORDER — FENTANYL-BUPIVACAINE-NACL 0.5-0.125-0.9 MG/250ML-% EP SOLN
12.0000 mL/h | EPIDURAL | Status: DC | PRN
  Filled 2019-12-27: qty 250

## 2019-12-27 MED ORDER — PENICILLIN G POT IN DEXTROSE 60000 UNIT/ML IV SOLN
3.0000 10*6.[IU] | INTRAVENOUS | Status: DC
  Administered 2019-12-27: 3 10*6.[IU] via INTRAVENOUS
  Filled 2019-12-27: qty 50

## 2019-12-27 MED ORDER — DIPHENHYDRAMINE HCL 50 MG/ML IJ SOLN: 12.5000 mg | INTRAMUSCULAR | Status: DC | PRN

## 2019-12-27 MED ORDER — SIMETHICONE 80 MG PO CHEW: 80.0000 mg | CHEWABLE_TABLET | ORAL | Status: DC | PRN

## 2019-12-27 MED ORDER — ONDANSETRON HCL 4 MG/2ML IJ SOLN: 4.0000 mg | INTRAMUSCULAR | Status: DC | PRN

## 2019-12-27 NOTE — Anesthesia Procedure Notes (Signed)
Epidural Patient location during procedure: OB Start time: 12/27/2019 10:25 AM End time: 12/27/2019 10:31 AM  Staffing Anesthesiologist: Lowella Curb, MD Performed: anesthesiologist   Preanesthetic Checklist Completed: patient identified, IV checked, site marked, risks and benefits discussed, surgical consent, monitors and equipment checked, pre-op evaluation and timeout performed  Epidural Patient position: sitting Prep: ChloraPrep Patient monitoring: heart rate, cardiac monitor, continuous pulse ox and blood pressure Approach: midline Location: L2-L3 Injection technique: LOR saline  Needle:  Needle type: Tuohy  Needle gauge: 17 G Needle length: 9 cm Needle insertion depth: 4 cm Catheter type: closed end flexible Catheter size: 20 Guage Catheter at skin depth: 8 cm Test dose: negative  Assessment Events: blood not aspirated, injection not painful, no injection resistance, no paresthesia and negative IV test  Additional Notes Epidural placed by SRNA under direct supervisionReason for block:procedure for pain

## 2019-12-27 NOTE — Lactation Note (Signed)
This note was copied from a baby's chart. Lactation Consultation Note  Patient Name: Diane [name] [id number] Date: 12/27/2019  Attempt to see mom for inititial lactation consult.  Family sleeping.   Maternal Data    Feeding Feeding Type: Breast Fed  [facility] Score                   Interventions    Lactation Tools Discussed/Used     Consult Status      Diane [name] 12/27/2019, 11:05 PM

## 2019-12-27 NOTE — Anesthesia Preprocedure Evaluation (Signed)
Anesthesia Evaluation  Patient identified by MRN, date of birth, ID band Patient awake    Reviewed: Allergy & Precautions, H&P , NPO status , Patient's Chart, lab work & pertinent test results  Airway Mallampati: II  TM Distance: >3 FB Neck ROM: full    Dental no notable dental hx.    Pulmonary neg pulmonary ROS,    Pulmonary exam normal breath sounds clear to auscultation       Cardiovascular negative cardio ROS Normal cardiovascular exam Rhythm:regular Rate:Normal     Neuro/Psych    GI/Hepatic   Endo/Other    Renal/GU      Musculoskeletal   Abdominal   Peds  Hematology   Anesthesia Other Findings   Reproductive/Obstetrics (+) Pregnancy                             Anesthesia Physical  Anesthesia Plan  ASA: II  Anesthesia Plan: Epidural   Post-op Pain Management:    Induction: Intravenous  PONV Risk Score and Plan: 2 and Treatment may vary due to age or medical condition  Airway Management Planned: Natural Airway  Additional Equipment:   Intra-op Plan:   Post-operative Plan:   Informed Consent: I have reviewed the patients History and Physical, chart, labs and discussed the procedure including the risks, benefits and alternatives for the proposed anesthesia with the patient or authorized representative who has indicated his/her understanding and acceptance.       Plan Discussed with: Anesthesiologist  Anesthesia Plan Comments:         Anesthesia Quick Evaluation

## 2019-12-27 NOTE — H&P (Signed)
Diane [name] is a 34 y.o. female presenting for IOL for history of IUGR. OB History    Gravida  3   Para  2   Term  2   Preterm      AB      Living  2     SAB      TAB      Ectopic      Multiple  0   Live Births  2          Past Medical History:  Diagnosis Date  . Anemia    AFTER DELIVERY  . History of UTI   . Vaginal Pap smear, abnormal    2015   Past Surgical History:  Procedure Laterality Date  . APPENDECTOMY  05/06/2015   laproscopic  . LAPAROSCOPIC APPENDECTOMY N/A 05/06/2015   Procedure: APPENDECTOMY LAPAROSCOPIC;  Surgeon: Georganna Skeans, MD;  Location: [facility];  Service: General;  Laterality: N/A;  . NO PAST SURGERIES    . PERINEAL LACERATION REPAIR N/A 06/12/2016   Procedure: Revision/REPAIR of Postpartum PERINEAL Dehiscence;  Surgeon: Brien Few, MD;  Location: [facility];  Service: Gynecology;  Laterality: N/A;   Family History: family history includes Cancer in her maternal grandmother, mother, and paternal grandmother; Endometriosis in her mother; Heart disease in her maternal grandfather; Hypertension in her maternal grandmother; Miscarriages / [place] in her mother; Stroke in her paternal grandmother. Social History:  reports that she has never smoked. She has never used smokeless tobacco. She reports previous alcohol use. She reports that she does not use drugs.     Maternal Diabetes: No Genetic Screening: Normal Maternal Ultrasounds/Referrals: SGA vs IUGR Fetal Ultrasounds or other Referrals:  None Maternal Substance Abuse:  No Significant Maternal Medications:  None Significant Maternal Lab Results:  Group B Strep positive Other Comments:  None  Review of Systems  Constitutional: Negative.   All other systems reviewed and are negative.  Maternal Medical History:  Reason for admission: Contractions.   Contractions: Onset was less than 1 hour ago.   Frequency: rare.   Perceived severity is mild.    Fetal activity: Perceived  fetal activity is normal.   Last perceived fetal movement was within the past hour.    Prenatal complications: IUGR.   Prenatal Complications - Diabetes: none.    Dilation: 3.5 Effacement (%): 70 Station: -2 Exam by:: Ignacia Felling, RNC unknown if currently breastfeeding. Maternal Exam:  Uterine Assessment: Contraction strength is mild.  Contraction frequency is irregular.   Abdomen: Patient reports no abdominal tenderness. Fetal presentation: vertex  Introitus: Normal vulva. Normal vagina.  Ferning test: not done.  Nitrazine test: not done. Amniotic fluid character: not assessed.  Pelvis: adequate for delivery.   Cervix: Cervix evaluated by digital exam.     Physical Exam  Nursing note and vitals reviewed. Constitutional: She is oriented to person, place, and time.  HENT:  Head: Normocephalic and atraumatic.  Cardiovascular: Normal rate, regular rhythm, normal heart sounds and normal pulses.  GI: Soft. Normal appearance.  Genitourinary:    Vulva normal.   Musculoskeletal:        General: Normal range of motion.     Cervical back: Normal range of motion and neck supple.  Neurological: She is alert and oriented to person, place, and time.  Skin: Skin is warm and dry.  Psychiatric: Her behavior is normal. Mood normal.    Prenatal labs: ABO, Rh: --/--/O POS, O POS Performed at [facility], [address]  [place], [place] [address]  [phone]) Antibody: NEG (06/23 2929) Rubella: Equivocal (11/25 0000) RPR: Nonreactive (11/25 0000)  HBsAg: Negative (11/25 0000)  HIV: Non-reactive (11/25 0000)  GBS: Positive/-- (06/15 0000)   Assessment/Plan: SGA vs IUGR History of IUGR GBS pos IOL, IV ABX   Diane [name] J 12/27/2019, 9:24 AM

## 2019-12-28 DIAGNOSIS — O9902 Anemia complicating childbirth: Secondary | ICD-10-CM | POA: Diagnosis present

## 2019-12-28 LAB — CBC
HCT: 26.9 % — ABNORMAL LOW (ref 36.0–46.0)
Hemoglobin: 8.4 g/dL — ABNORMAL LOW (ref 12.0–15.0)
MCH: 28.9 pg (ref 26.0–34.0)
MCHC: 31.2 g/dL (ref 30.0–36.0)
MCV: 92.4 fL (ref 80.0–100.0)
Platelets: 191 10*3/uL (ref 150–400)
RBC: 2.91 MIL/uL — ABNORMAL LOW (ref 3.87–5.11)
RDW: 13 % (ref 11.5–15.5)
WBC: 7.5 10*3/uL (ref 4.0–10.5)
nRBC: 0 % (ref 0.0–0.2)

## 2019-12-28 MED ORDER — COCONUT OIL OIL: 1.0000 "application " | TOPICAL_OIL | 0 refills | Status: DC | PRN

## 2019-12-28 MED ORDER — FERROUS SULFATE 325 (65 FE) MG PO TABS: 325.0000 mg | ORAL_TABLET | Freq: Two times a day (BID) | ORAL | 3 refills | Status: DC

## 2019-12-28 MED ORDER — WITCH HAZEL-GLYCERIN EX PADS: 1.0000 "application " | MEDICATED_PAD | CUTANEOUS | 12 refills | Status: DC | PRN

## 2019-12-28 MED ORDER — MEASLES, MUMPS & RUBELLA VAC IJ SOLR
0.5000 mL | Freq: Once | INTRAMUSCULAR | Status: AC
  Administered 2019-12-28: 0.5 mL via SUBCUTANEOUS
  Filled 2019-12-28: qty 0.5

## 2019-12-28 MED ORDER — IBUPROFEN 600 MG PO TABS: 600.0000 mg | ORAL_TABLET | Freq: Four times a day (QID) | ORAL | 0 refills | Status: DC

## 2019-12-28 MED ORDER — FERROUS SULFATE 325 (65 FE) MG PO TABS
325.0000 mg | ORAL_TABLET | Freq: Two times a day (BID) | ORAL | Status: DC
  Administered 2019-12-28: 325 mg via ORAL
  Filled 2019-12-28: qty 1

## 2019-12-28 MED ORDER — MAGNESIUM OXIDE 400 (241.3 MG) MG PO TABS: 400.0000 mg | ORAL_TABLET | Freq: Every day | ORAL | 1 refills | Status: DC

## 2019-12-28 MED ORDER — MAGNESIUM OXIDE 400 (241.3 MG) MG PO TABS
400.0000 mg | ORAL_TABLET | Freq: Every day | ORAL | Status: DC
  Administered 2019-12-28: 400 mg via ORAL
  Filled 2019-12-28: qty 1

## 2019-12-28 MED ORDER — BENZOCAINE-MENTHOL 20-0.5 % EX AERO: 1.0000 "application " | INHALATION_SPRAY | CUTANEOUS | Status: DC | PRN

## 2019-12-28 NOTE — Progress Notes (Signed)
PPD #1, SVD, intact, baby girl "Lanora Manis"  S:  Reports feeling okay, c/o intense cramping, but managing with Motrin and Tylenol. Desires d/c home today.              Tolerating po/ No nausea or vomiting / Denies dizziness or SOB             Bleeding is light             Up ad lib / ambulatory / voiding QS without difficulty  Newborn breast feeding - going well  O:               VS: BP 90/64 (BP Location: Right Arm)   Pulse (!) 55   Temp 97.9 F (36.6 C) (Oral)   Resp 18   Ht 5\' 3"  (1.6 m)   Wt 65.3 kg   SpO2 100%   Breastfeeding Unknown   BMI 25.51 kg/m  Patient Vitals for the past 24 hrs:  BP Temp Temp src Pulse Resp SpO2  12/28/19 0442 90/64 97.9 F (36.6 C) Oral (!) 55 18 100 %  12/28/19 0054 (!) 92/57 -- -- 60 19 99 %  12/27/19 2052 92/65 98.6 F (37 C) -- 62 19 100 %    LABS:              Recent Labs    12/27/19 0822 12/28/19 0544  WBC 6.6 7.5  HGB 10.9* 8.4*  PLT 252 191               Blood type: --/--/O POS, O POS Performed at [facility], [address] [place], [place]  [phone] ([phone]/76)  Rubella: Equivocal (11/25 0000)                     I&O: Intake/Output      06/23 0701 - 06/24 0700 06/24 0701 - 06/25 0700   Urine (mL/kg/hr) 500    Blood 103    Total Output 603    Net -603                       Physical Exam:             Alert and oriented X3  Lungs: Clear and unlabored  Heart: regular rate and rhythm / no murmurs  Abdomen: soft, non-tender, non-distended              Fundus: firm, non-tender, U-2  Perineum: intact, mild labial edema  Lochia: small rubra on pad  Extremities: trace LE edema, no calf pain or tenderness    A/P: PPD # 1, SVD  Acute on chronic anemia   - Begin Ferrous sulfate 324mg  BID WC x 6 weeks    - Magnesium oxide 400mg  PO daily  Doing well - stable status  Routine post partum orders  Discharge home today  WOB discharge book given, instructions and warning s/s reviewed   F/u with Dr. 7/25 in 6 weeks    , MSN, CNM [facility]

## 2019-12-28 NOTE — Anesthesia Postprocedure Evaluation (Signed)
Anesthesia Post Note  Patient: Diane [name]  Procedure(s) Performed: AN AD HOC LABOR EPIDURAL     Patient location during evaluation: Mother Baby Anesthesia Type: Epidural Level of consciousness: awake, awake and alert and oriented Pain management: pain level controlled Vital Signs Assessment: post-procedure vital signs reviewed and stable Respiratory status: spontaneous breathing, nonlabored ventilation and respiratory function stable Cardiovascular status: stable Postop Assessment: no headache, patient able to bend at knees, no apparent nausea or vomiting, adequate PO intake, able to ambulate and no backache Anesthetic complications: no   No complications documented.  Last Vitals:  Vitals:   12/28/19 0054 12/28/19 0442  BP: (!) 92/57 90/64  Pulse: 60 (!) 55  Resp: 19 18  Temp:  36.6 C  SpO2: 99% 100%    Last Pain:  Vitals:   12/28/19 0545  TempSrc:   PainSc: Asleep   Pain Goal:                   [name]

## 2019-12-28 NOTE — Lactation Note (Signed)
This note was copied from a baby's chart. Lactation Consultation Note  Patient Name: Diane [name] TYYPE'[id number] Date: 12/28/2019 Reason for consult: Initial assessment;Term  P3 mother whose infant is now 38 hours old.  This is a term baby at 39+0 weeks.  Mother has breast feeding experience with her 57 and 32 year old children.  Baby was swaddled and asleep in mother's arms when I arrived.  Mother already has a discharge order in and will leave as soon as possible after the 24 hour testing.  She had no questions/concerns related to breast feeding and feels like her daughter is latching well.  Reviewed engorgement prevention/treatment.  Mother has a manual pump and a DEBP for home use.  She has our OP phone number for questions after discharge.  Father present.  RN updated.   Maternal Data    Feeding Feeding Type: Breast Fed  LATCH Score                   Interventions    Lactation Tools Discussed/Used     Consult Status Consult Status: Complete    [name] R [name] 12/28/2019, 10:54 AM

## 2019-12-28 NOTE — Discharge Summary (Signed)
Postpartum Discharge Summary  Date of Service updated 12/28/2019     Patient Name: Diane [name] DOB: 04/11/1986 MRN: [id number]  Date of admission: 12/27/2019 Delivery date:12/27/2019  Delivering provider: Brien Few  Date of discharge: 12/28/2019  Admitting diagnosis: Encounter for induction of labor [Z34.90] Intrauterine pregnancy: [redacted]w[redacted]d    Secondary diagnosis:  Principal Problem:   Postpartum care following vaginal delivery 6/23 Active Problems:   SVD    Encounter for induction of labor   Maternal anemia, with delivery  Additional problems: Hx. Of IUGRand IUGR vs. SGA   Discharge diagnosis: Term Pregnancy Delivered and Anemia                                              Post partum procedures:MMR vaccine Augmentation: AROM and Pitocin Complications: None  Hospital course: Induction of Labor With Vaginal Delivery   34y.o. yo G3P3003 at 326w0das admitted to the hospital 12/27/2019 for induction of labor.  Indication for induction: Hx. of IUGR; SGA vs. IUGR.  Patient had an uncomplicated labor course as follows: Membrane Rupture Time/Date: 9:55 AM ,12/27/2019   Delivery Method:Vaginal, Spontaneous  Episiotomy: None  Lacerations:  None  Details of delivery can be found in separate delivery note.  Patient had a routine postpartum course. Patient is discharged home 12/28/19.  Newborn Data: Birth date:12/27/2019  Birth time:12:47 PM  Gender:Female  Living status:Living  Apgars:9 ,9  Weight:3040 g   "Diane [name]"  Magnesium Sulfate received: No BMZ received: No Rhophylac:N/A MMR:Yes on 12/28/2019 T-DaP:Given prenatally Flu: UTD Transfusion:No  Physical exam  Vitals:   12/27/19 1630 12/27/19 2052 12/28/19 0054 12/28/19 0442  BP: 101/62 92/65 (!) 92/57 90/64  Pulse: 67 62 60 (!) 55  Resp: '18 19 19 18  ' Temp: 98.6 F (37 C) 98.6 F (37 C)  97.9 F (36.6 C)  TempSrc: Oral   Oral  SpO2: 100% 100% 99% 100%  Weight:      Height:        Alert and oriented  X3             Lungs: Clear and unlabored             Heart: regular rate and rhythm / no murmurs             Abdomen: soft, non-tender, non-distended              Fundus: firm, non-tender, U-2             Perineum: intact, mild labial edema             Lochia: small rubra on pad             Extremities: trace LE edema, no calf pain or tenderness               Labs: Lab Results  Component Value Date   WBC 7.5 12/28/2019   HGB 8.4 (L) 12/28/2019   HCT 26.9 (L) 12/28/2019   MCV 92.4 12/28/2019   PLT 191 12/28/2019   CMP Latest Ref Rng & Units 05/07/2015  Glucose 65 - 99 mg/dL 161(H)  BUN 6 - 20 mg/dL 6  Creatinine 0.44 - 1.00 mg/dL 0.76  Sodium 135 - 145 mmol/L 130(L)  Potassium 3.5 - 5.1 mmol/L 4.6  Chloride 101 - 111 mmol/L 99(L)  CO2 22 - 32 mmol/L  22  Calcium 8.9 - 10.3 mg/dL 7.9(L)  Total Protein 6.5 - 8.1 g/dL -  Total Bilirubin 0.3 - 1.2 mg/dL -  Alkaline Phos 38 - 126 U/L -  AST 15 - 41 U/L -  ALT 14 - 54 U/L -   Edinburgh Score: Edinburgh Postnatal Depression Scale Screening Tool 12/27/2019  I have been able to laugh and see the funny side of things. 0  I have looked forward with enjoyment to things. 0  I have blamed myself unnecessarily when things went wrong. 0  I have been anxious or worried for no good reason. 0  I have felt scared or panicky for no good reason. 0  Things have been getting on top of me. 0  I have been so unhappy that I have had difficulty sleeping. 0  I have felt sad or miserable. 0  I have been so unhappy that I have been crying. 0  The thought of harming myself has occurred to me. 0  Edinburgh Postnatal Depression Scale Total 0      After visit meds:  Allergies as of 12/28/2019   No Known Allergies     Medication List    STOP taking these medications   dibucaine 1 % Oint Commonly known as: NUPERCAINAL     TAKE these medications   acetaminophen 325 MG tablet Commonly known as: Tylenol Take 2 tablets (650 mg total) by mouth  every 4 (four) hours as needed (for pain scale < 4).   benzocaine-Menthol 20-0.5 % Aero Commonly known as: DERMOPLAST Apply 1 application topically as needed for irritation (perineal discomfort).   coconut oil Oil Apply 1 application topically as needed.   ferrous sulfate 325 (65 FE) MG tablet Take 1 tablet (325 mg total) by mouth 2 (two) times daily with a meal.   guaiFENesin 600 MG 12 hr tablet Commonly known as: MUCINEX Take 600 mg by mouth 2 (two) times daily as needed for to loosen phlegm.   ibuprofen 600 MG tablet Commonly known as: ADVIL Take 1 tablet (600 mg total) by mouth every 6 (six) hours.   magnesium oxide 400 (241.3 Mg) MG tablet Commonly known as: MAG-OX Take 1 tablet (400 mg total) by mouth daily.   prenatal multivitamin Tabs tablet Take 1 tablet by mouth daily at 12 noon.   witch hazel-glycerin pad Commonly known as: TUCKS Apply 1 application topically as needed for hemorrhoids.        Discharge home in stable condition Infant Feeding: Breast Infant Disposition:home with mother Discharge instruction: per After Visit Summary and Postpartum booklet. WOB discharge book Activity: Advance as tolerated. Pelvic rest for 6 weeks.  Diet: low salt diet Anticipated Birth Control: Unsure Postpartum Appointment:6 weeks Additional Postpartum F/U: Postpartum Depression checkup Future Appointments:No future appointments. Follow up Visit:  Follow-up Information    Brien Few, MD. Schedule an appointment as soon as possible for a visit in 6 week(s).   Specialty: Obstetrics and Gynecology Why: Postpartum visit Contact information: [place] [address] [phone]                   12/28/2019 Darliss Cheney, CNM

## 2020-06-20 ENCOUNTER — Other Ambulatory Visit: Payer: Managed Care, Other (non HMO)

## 2020-06-20 DIAGNOSIS — Z20822 Contact with and (suspected) exposure to covid-19: Secondary | ICD-10-CM

## 2020-06-22 LAB — SARS-COV-2, NAA 2 DAY TAT

## 2020-06-22 LAB — NOVEL CORONAVIRUS, NAA: SARS-CoV-2, NAA: DETECTED — AB

## 2023-05-10 LAB — OB RESULTS CONSOLE RPR: RPR: NONREACTIVE

## 2023-05-10 LAB — OB RESULTS CONSOLE HIV ANTIBODY (ROUTINE TESTING): HIV: NONREACTIVE

## 2023-05-10 LAB — OB RESULTS CONSOLE RUBELLA ANTIBODY, IGM: Rubella: IMMUNE

## 2023-05-10 LAB — OB RESULTS CONSOLE HEPATITIS B SURFACE ANTIGEN: Hepatitis B Surface Ag: NEGATIVE

## 2023-08-14 ENCOUNTER — Inpatient Hospital Stay (HOSPITAL_COMMUNITY)
Admission: AD | Admit: 2023-08-14 | Discharge: 2023-08-14 | Disposition: A | Discharge status: Home / Self Care | Payer: Managed Care, Other (non HMO) | Attending: Obstetrics and Gynecology | Admitting: Obstetrics and Gynecology

## 2023-08-14 ENCOUNTER — Inpatient Hospital Stay (HOSPITAL_COMMUNITY): Payer: Managed Care, Other (non HMO)

## 2023-08-14 ENCOUNTER — Encounter: Payer: Self-pay | Admitting: Student

## 2023-08-14 DIAGNOSIS — J101 Influenza due to other identified influenza virus with other respiratory manifestations: Secondary | ICD-10-CM | POA: Diagnosis present

## 2023-08-14 DIAGNOSIS — O99512 Diseases of the respiratory system complicating pregnancy, second trimester: Secondary | ICD-10-CM | POA: Insufficient documentation

## 2023-08-14 DIAGNOSIS — Z3A24 24 weeks gestation of pregnancy: Secondary | ICD-10-CM | POA: Insufficient documentation

## 2023-08-14 DIAGNOSIS — O26892 Other specified pregnancy related conditions, second trimester: Secondary | ICD-10-CM | POA: Diagnosis not present

## 2023-08-14 DIAGNOSIS — Z3689 Encounter for other specified antenatal screening: Secondary | ICD-10-CM

## 2023-08-14 LAB — CBC WITH DIFFERENTIAL/PLATELET
Abs Immature Granulocytes: 0.03 10*3/uL (ref 0.00–0.07)
Basophils Absolute: 0 10*3/uL (ref 0.0–0.1)
Basophils Relative: 0 %
Eosinophils Absolute: 0 10*3/uL (ref 0.0–0.5)
Eosinophils Relative: 0 %
HCT: 33.2 % — ABNORMAL LOW (ref 36.0–46.0)
Hemoglobin: 11.3 g/dL — ABNORMAL LOW (ref 12.0–15.0)
Immature Granulocytes: 1 %
Lymphocytes Relative: 7 %
Lymphs Abs: 0.4 10*3/uL — ABNORMAL LOW (ref 0.7–4.0)
MCH: 32.1 pg (ref 26.0–34.0)
MCHC: 34 g/dL (ref 30.0–36.0)
MCV: 94.3 fL (ref 80.0–100.0)
Monocytes Absolute: 0.3 10*3/uL (ref 0.1–1.0)
Monocytes Relative: 5 %
Neutro Abs: 5.4 10*3/uL (ref 1.7–7.7)
Neutrophils Relative %: 87 %
Platelets: 180 10*3/uL (ref 150–400)
RBC: 3.52 MIL/uL — ABNORMAL LOW (ref 3.87–5.11)
RDW: 13.5 % (ref 11.5–15.5)
WBC: 6.2 10*3/uL (ref 4.0–10.5)
nRBC: 0 % (ref 0.0–0.2)

## 2023-08-14 LAB — COMPREHENSIVE METABOLIC PANEL
ALT: 27 U/L (ref 0–44)
AST: 29 U/L (ref 15–41)
Albumin: 2.9 g/dL — ABNORMAL LOW (ref 3.5–5.0)
Alkaline Phosphatase: 66 U/L (ref 38–126)
Anion gap: 12 (ref 5–15)
BUN: 5 mg/dL — ABNORMAL LOW (ref 6–20)
CO2: 20 mmol/L — ABNORMAL LOW (ref 22–32)
Calcium: 8.7 mg/dL — ABNORMAL LOW (ref 8.9–10.3)
Chloride: 101 mmol/L (ref 98–111)
Creatinine, Ser: 0.73 mg/dL (ref 0.44–1.00)
GFR, Estimated: >60 mL/min (ref >60)
Glucose, Bld: 112 mg/dL — ABNORMAL HIGH (ref 70–99)
Potassium: 3.2 mmol/L — ABNORMAL LOW (ref 3.5–5.1)
Sodium: 133 mmol/L — ABNORMAL LOW (ref 135–145)
Total Bilirubin: 0.9 mg/dL (ref 0.0–1.2)
Total Protein: 5.7 g/dL — ABNORMAL LOW (ref 6.5–8.1)

## 2023-08-14 LAB — RESP PANEL BY RT-PCR (RSV, FLU A&B, COVID)  RVPGX2
Influenza A by PCR: POSITIVE — AB
Influenza B by PCR: NEGATIVE
Resp Syncytial Virus by PCR: NEGATIVE
SARS Coronavirus 2 by RT PCR: NEGATIVE

## 2023-08-14 MED ORDER — ACETAMINOPHEN 500 MG PO TABS
1000.0000 mg | ORAL_TABLET | Freq: Once | ORAL | Status: AC
  Administered 2023-08-14: 1000 mg via ORAL
  Filled 2023-08-14: qty 2

## 2023-08-14 MED ORDER — LACTATED RINGERS IV BOLUS
1000.0000 mL | Freq: Once | INTRAVENOUS | Status: AC
  Administered 2023-08-14: 1000 mL via INTRAVENOUS

## 2023-08-14 MED ORDER — OSELTAMIVIR PHOSPHATE 75 MG PO CAPS: 75.0000 mg | ORAL_CAPSULE | Freq: Two times a day (BID) | ORAL | 0 refills | Status: DC

## 2023-08-14 MED ORDER — OSELTAMIVIR PHOSPHATE 75 MG PO CAPS
75.0000 mg | ORAL_CAPSULE | Freq: Once | ORAL | Status: AC
  Administered 2023-08-14: 75 mg via ORAL
  Filled 2023-08-14: qty 1

## 2023-08-14 NOTE — MAU Provider Note (Signed)
 History     CSN: [id number]  Arrival date and time: 08/14/23 1859   Event Date/Time   First Provider Initiated Contact with Patient 08/14/23 2052      Chief Complaint  Patient presents with   Fever   Shortness of Breath   Cough   Shaylinn Hladik is a 38 y.o. G4P3003 at [redacted]w[redacted]d who receives care at [facility] under care of Dr. FABIENE Flowers. She presents today for flu-like symptoms that started Wednesday. Patient reports that everyone in the home is sick.  Patient states she has been experiencing fever, chills, SOB, body aches, and DFM.  She reports taking tylenol , but continues to have fever with last temp 102.  Patient denies vaginal bleeding or discharge, cramping or contractions.    OB History     Gravida  4   Para  3   Term  3   Preterm      AB      Living  3      SAB      IAB      Ectopic      Multiple  0   Live Births  3           Past Medical History:  Diagnosis Date   Anemia    AFTER DELIVERY   History of UTI    Vaginal Pap smear, abnormal    2015    Past Surgical History:  Procedure Laterality Date   APPENDECTOMY  05/06/2015   laproscopic   LAPAROSCOPIC APPENDECTOMY N/A 05/06/2015   Procedure: APPENDECTOMY LAPAROSCOPIC;  Surgeon: Dann Hummer, MD;  Location: [facility] OR;  Service: General;  Laterality: N/A;   NO PAST SURGERIES     PERINEAL LACERATION REPAIR N/A 06/12/2016   Procedure: Revision/REPAIR of Postpartum PERINEAL Dehiscence;  Surgeon: Charlie Flowers, MD;  Location: [facility] ORS;  Service: Gynecology;  Laterality: N/A;    Family History  Problem Relation Age of Onset   Cancer Mother    Miscarriages / Stillbirths Mother    Endometriosis Mother    Cancer Maternal Grandmother    Hypertension Maternal Grandmother    Heart disease Maternal Grandfather    Stroke Paternal Grandmother    Cancer Paternal Grandmother     Social History   Tobacco Use   Smoking status: Never   Smokeless tobacco: Never  Vaping Use   Vaping status: Never  Used  Substance Use Topics   Alcohol use: Not Currently    Comment: occassionally   Drug use: No    Allergies: No Known Allergies  Medications Prior to Admission  Medication Sig Dispense Refill Last Dose/Taking   acetaminophen  (TYLENOL ) 325 MG tablet Take 2 tablets (650 mg total) by mouth every 4 (four) hours as needed (for pain scale < 4). (Patient taking differently: Take 500 mg by mouth every 4 (four) hours as needed (for pain scale < 4).)   08/14/2023 at  1:30 PM   docusate sodium  (COLACE) 50 MG capsule Take 50 mg by mouth 2 (two) times daily.   08/13/2023 at 10:00 PM   guaiFENesin (MUCINEX) 600 MG 12 hr tablet Take 600 mg by mouth 2 (two) times daily as needed for to loosen phlegm.   08/14/2023 at  9:00 AM   Prenatal Vit-Fe Fumarate-FA (PRENATAL MULTIVITAMIN) TABS tablet Take 1 tablet by mouth daily at 12 noon.   08/13/2023 at 10:00 PM   benzocaine -Menthol  (DERMOPLAST) 20-0.5 % AERO Apply 1 application topically as needed for irritation (perineal discomfort).  coconut oil OIL Apply 1 application topically as needed.  0    ferrous sulfate  325 (65 FE) MG tablet Take 1 tablet (325 mg total) by mouth 2 (two) times daily with a meal. 60 tablet 3    ibuprofen  (ADVIL ) 600 MG tablet Take 1 tablet (600 mg total) by mouth every 6 (six) hours. 30 tablet 0    magnesium  oxide (MAG-OX) 400 (241.3 Mg) MG tablet Take 1 tablet (400 mg total) by mouth daily. 30 tablet 1    witch hazel-glycerin  (TUCKS) pad Apply 1 application topically as needed for hemorrhoids. 40 each 12     Review of Systems  Constitutional:  Positive for chills and fever.  Respiratory:  Positive for cough and shortness of breath.   Cardiovascular:  Negative for chest pain.  Gastrointestinal:  Negative for abdominal pain.  Genitourinary:  Negative for difficulty urinating, dysuria, vaginal bleeding and vaginal discharge.  Musculoskeletal:  Positive for myalgias.   Physical Exam   Blood pressure (!) 94/52, pulse 100, temperature (!)  102.4 F (39.1 C), temperature source Oral, resp. rate (!) 36, height 5' 3 (1.6 m), weight 56.2 kg, SpO2 99%, unknown if currently breastfeeding.  Physical Exam Vitals reviewed. Exam conducted with a chaperone present Reggy, RN).  Constitutional:      Appearance: She is well-developed.  HENT:     Head: Normocephalic and atraumatic.  Eyes:     Conjunctiva/sclera: Conjunctivae normal.  Cardiovascular:     Rate and Rhythm: Tachycardia present.  Pulmonary:     Effort: Pulmonary effort is normal. No accessory muscle usage or respiratory distress.  Abdominal:     Palpations: Abdomen is soft.     Comments: Gravid, Appears AGA  Musculoskeletal:        General: Normal range of motion.     Cervical back: Normal range of motion.  Skin:    General: Skin is warm and dry.  Neurological:     Mental Status: She is alert and oriented to person, place, and time.  Psychiatric:        Mood and Affect: Mood normal.     Fetal Assessment 155 bpm, Mod Var, -Decels, +Accels Toco: No ctx  MAU Course   Results for orders placed or performed during the hospital encounter of 08/14/23 (from the past 24 hours)  Resp panel by RT-PCR (RSV, Flu A&B, Covid) Anterior Nasal Swab     Status: Abnormal   Collection Time: 08/14/23  7:25 PM   Specimen: Anterior Nasal Swab  Result Value Ref Range   SARS Coronavirus 2 by RT PCR NEGATIVE NEGATIVE   Influenza A by PCR POSITIVE (A) NEGATIVE   Influenza B by PCR NEGATIVE NEGATIVE   Resp Syncytial Virus by PCR NEGATIVE NEGATIVE  CBC with Differential/Platelet     Status: Abnormal   Collection Time: 08/14/23  8:36 PM  Result Value Ref Range   WBC 6.2 4.0 - 10.5 K/uL   RBC 3.52 (L) 3.87 - 5.11 MIL/uL   Hemoglobin 11.3 (L) 12.0 - 15.0 g/dL   HCT 66.7 (L) 63.9 - 53.9 %   MCV 94.3 80.0 - 100.0 fL   MCH 32.1 26.0 - 34.0 pg   MCHC 34.0 30.0 - 36.0 g/dL   RDW 86.4 88.4 - 84.4 %   Platelets 180 150 - 400 K/uL   nRBC 0.0 0.0 - 0.2 %   Neutrophils Relative % 87 %    Neutro Abs 5.4 1.7 - 7.7 K/uL   Lymphocytes Relative 7 %  Lymphs Abs 0.4 (L) 0.7 - 4.0 K/uL   Monocytes Relative 5 %   Monocytes Absolute 0.3 0.1 - 1.0 K/uL   Eosinophils Relative 0 %   Eosinophils Absolute 0.0 0.0 - 0.5 K/uL   Basophils Relative 0 %   Basophils Absolute 0.0 0.0 - 0.1 K/uL   Immature Granulocytes 1 %   Abs Immature Granulocytes 0.03 0.00 - 0.07 K/uL  Comprehensive metabolic panel     Status: Abnormal   Collection Time: 08/14/23  8:36 PM  Result Value Ref Range   Sodium 133 (L) 135 - 145 mmol/L   Potassium 3.2 (L) 3.5 - 5.1 mmol/L   Chloride 101 98 - 111 mmol/L   CO2 20 (L) 22 - 32 mmol/L   Glucose, Bld 112 (H) 70 - 99 mg/dL   BUN 5 (L) 6 - 20 mg/dL   Creatinine, Ser 9.26 0.44 - 1.00 mg/dL   Calcium  8.7 (L) 8.9 - 10.3 mg/dL   Total Protein 5.7 (L) 6.5 - 8.1 g/dL   Albumin 2.9 (L) 3.5 - 5.0 g/dL   AST 29 15 - 41 U/L   ALT 27 0 - 44 U/L   Alkaline Phosphatase 66 38 - 126 U/L   Total Bilirubin 0.9 0.0 - 1.2 mg/dL   GFR, Estimated >39 >39 mL/min   Anion gap 12 5 - 15   DG Chest Portable 1 View Result Date: 08/14/2023 CLINICAL DATA:  Shortness of breath and cough. EXAM: PORTABLE CHEST 1 VIEW COMPARISON:  None Available. FINDINGS: Mild central vascular congestion. No focal consolidation, pleural effusion or pneumothorax. No acute osseous pathology. IMPRESSION: Mild central vascular congestion. No focal consolidation. Electronically Signed   By: Vanetta Chou M.D.   On: 08/14/2023 20:22    MDM PE Labs: Respiratory Panel, CBC/D, CMP EFM Tylenol  for Fever Start IV LR Bolus CXR EKG Assessment and Plan  38 year old G4P3003  SIUP at 24.3 weeks Cat I FT Flu-Like Symptoms  -Orders placed while patient in triage. -CXR returns as above. -Results reviewed with Dr. RONAL Sor who recommends EKG.  -Respiratory panel positive for Influenza A. -Provider to bedside to discuss results, thus far.  -Exam performed. -Discussed treatment with Tamiflu  and patient  agreeable. -Initial dose to be given now.  -Discussed usage of OTC medications for cough.  -List to be provided in AVS.  -EKG in progress.   Harlene LITTIE Duncans MSN, CNM 08/14/2023, 8:52 PM   Reassessment (9:05 PM) -Strip reviewed by Dr. RONAL Sor who recommends following up with Cardiology for reading of EKG. -Give additional liter fluid  Reassessment (10:00 PM) -Fluids complete -Cardiologist, Tobie, calls back stating he is aware of provider page, but has to will return call in ~5 min d/t other engagement. -Returns call and states that nonspecific and in absence of chest pain would repeat once symptoms improved.  -Patient s/p 2 LR boluses however, unlikely to correct hypokalemia in setting of illness.  Will send script for oral treatment.  -Tachycardia has improved. BP low, but patient reports blood pressures typically run from 90-100s/50s-60s. Review of PNR confirm. -Precautions reviewed. -Encouraged rest and hydration. -Instructed to NOT go to work on Monday.  OOW note provided. -Encouraged to call primary office or return to MAU if symptoms worsen or with the onset of new symptoms. -Discharged to home in stable condition.  Harlene LITTIE Duncans MSN, CNM Advanced Practice Provider, [facility] [employer]

## 2023-08-14 NOTE — MAU Note (Signed)
.  Diane [name] is a 38 y.o. at [redacted]w[redacted]d here in MAU reporting: fever, chills, SOB -lethargy, generalized body aches-and DFM-reports symptoms started Wednesday-has been taking Tylenol - last dose 1330 Last temp at home 102 Denies SROM, vaginal bleeding or discharge. Denies cramping or contractions. Feels she may be having loss of urine with coughing  Onset of complaint: 08/11/2023 Pain score: lower abdomen 2                    Generalized body aches 3 Vitals:   08/14/23 1920  BP: (!) 97/58  Pulse: (!) 122  Resp: (!) 36  Temp: (!) 101.1 F (38.4 C)  SpO2: 98%     FHT:  168bpm Lab orders placed from triage:

## 2023-08-14 NOTE — Treatment Plan (Addendum)
 Diane [name] is a 39 y.o. at [redacted]w[redacted]d admitted for fever, chills, SOB found to have influenza. Paged for ECG interpretation. No reported chest pain. ECG was obtained for SOB, shows non-specific TWI in anterior leads and early r-wave transition. Labs notable for Na 133, K 3.2. Recommended electrolyte correction and repeat ECG. If there is on going concern about SOB from a cardiac cause, recommend obtaining BNP and possibly echocardiogram. If clinically indicated recommend work up for PE.

## 2023-08-14 NOTE — Discharge Instructions (Signed)

## 2023-08-15 ENCOUNTER — Other Ambulatory Visit: Payer: Self-pay

## 2023-08-15 DIAGNOSIS — E876 Hypokalemia: Secondary | ICD-10-CM

## 2023-08-15 MED ORDER — POTASSIUM CHLORIDE CRYS ER 10 MEQ PO TBCR: 10.0000 meq | EXTENDED_RELEASE_TABLET | Freq: Two times a day (BID) | ORAL | 0 refills | Status: DC

## 2023-08-27 ENCOUNTER — Other Ambulatory Visit: Payer: Self-pay | Admitting: Obstetrics and Gynecology

## 2023-08-27 ENCOUNTER — Ambulatory Visit
Admission: RE | Admit: 2023-08-27 | Discharge: 2023-08-27 | Disposition: A | Discharge status: Home / Self Care | Payer: Managed Care, Other (non HMO) | Source: Ambulatory Visit | Attending: Obstetrics and Gynecology | Admitting: Obstetrics and Gynecology

## 2023-08-27 DIAGNOSIS — J069 Acute upper respiratory infection, unspecified: Secondary | ICD-10-CM

## 2023-09-12 ENCOUNTER — Inpatient Hospital Stay (HOSPITAL_COMMUNITY)
Admission: AD | Admit: 2023-09-12 | Discharge: 2023-09-12 | Disposition: A | Discharge status: Home / Self Care | Attending: Obstetrics and Gynecology | Admitting: Obstetrics and Gynecology

## 2023-09-12 ENCOUNTER — Inpatient Hospital Stay (HOSPITAL_COMMUNITY)

## 2023-09-12 ENCOUNTER — Encounter (HOSPITAL_COMMUNITY): Payer: Self-pay | Admitting: Obstetrics and Gynecology

## 2023-09-12 DIAGNOSIS — O26853 Spotting complicating pregnancy, third trimester: Secondary | ICD-10-CM | POA: Insufficient documentation

## 2023-09-12 DIAGNOSIS — O09523 Supervision of elderly multigravida, third trimester: Secondary | ICD-10-CM | POA: Diagnosis not present

## 2023-09-12 DIAGNOSIS — O26893 Other specified pregnancy related conditions, third trimester: Secondary | ICD-10-CM | POA: Insufficient documentation

## 2023-09-12 DIAGNOSIS — Z3A28 28 weeks gestation of pregnancy: Secondary | ICD-10-CM | POA: Diagnosis not present

## 2023-09-12 DIAGNOSIS — O4413 Placenta previa with hemorrhage, third trimester: Secondary | ICD-10-CM

## 2023-09-12 DIAGNOSIS — O4403 Placenta previa specified as without hemorrhage, third trimester: Secondary | ICD-10-CM | POA: Diagnosis not present

## 2023-09-12 DIAGNOSIS — R58 Hemorrhage, not elsewhere classified: Secondary | ICD-10-CM

## 2023-09-12 LAB — CBC
HCT: 30.2 % — ABNORMAL LOW (ref 36.0–46.0)
Hemoglobin: 10.6 g/dL — ABNORMAL LOW (ref 12.0–15.0)
MCH: 33.1 pg (ref 26.0–34.0)
MCHC: 35.1 g/dL (ref 30.0–36.0)
MCV: 94.4 fL (ref 80.0–100.0)
Platelets: 172 10*3/uL (ref 150–400)
RBC: 3.2 MIL/uL — ABNORMAL LOW (ref 3.87–5.11)
RDW: 15 % (ref 11.5–15.5)
WBC: 6.2 10*3/uL (ref 4.0–10.5)
nRBC: 0 % (ref 0.0–0.2)

## 2023-09-12 LAB — URINALYSIS, ROUTINE W REFLEX MICROSCOPIC
Bacteria, UA: NONE SEEN
Bilirubin Urine: NEGATIVE
Glucose, UA: NEGATIVE mg/dL
Ketones, ur: 5 mg/dL — AB
Leukocytes,Ua: NEGATIVE
Nitrite: NEGATIVE
Protein, ur: NEGATIVE mg/dL
Specific Gravity, Urine: 1.015 (ref 1.005–1.030)
pH: 6 (ref 5.0–8.0)

## 2023-09-12 MED ORDER — FERROUS SULFATE 325 (65 FE) MG PO TABS: 325.0000 mg | ORAL_TABLET | ORAL | 2 refills | Status: DC

## 2023-09-12 NOTE — MAU Note (Signed)
 Diane [name] is a 38 y.o. at [redacted]w[redacted]d here in MAU reporting: vaginal bleeding. Pt states has a known previa but this is the first bleeding episode. Pt states she woke up around 0500 with light bleeding. Pt states she started to have lower abdominal cramping too. Pt rates the cramping 2/10. Pt states she is still wearing the same pad and describes the bleeding amount as light. Pt denies LOF. +FM   Onset of complaint: 0500 Pain score: 2/10 lower abdominal cramping  Vitals:   09/12/23 0617  BP: (!) 103/57  Pulse: 100  Resp: 18  Temp: 97.8 F (36.6 C)  SpO2: 99%     FHT: 141 Lab orders placed from triage:

## 2023-09-12 NOTE — MAU Provider Note (Signed)
 Chief Complaint:  Vaginal Bleeding   HPI     Diane [name] is a 38 y.o. G4P3003 at [redacted]w[redacted]d who presents to maternity admissions reporting that she woke up around 0500 and noticed vaginal bleeding. She reports it  to be light and also is now experiencing some mild cramping 2/10 on pain scale. Her pregnancy is complicated by a complete placenta Previa . Reports FM's are present and denies any LOF.   Pregnancy Course: [employer]   Past Medical History:  Diagnosis Date   Anemia    AFTER DELIVERY   History of UTI    Vaginal Pap smear, abnormal    2015   OB History  Gravida Para Term Preterm AB Living  4 3 3   3   SAB IAB Ectopic Multiple Live Births     0 3    # Outcome Date GA Lbr Len/2nd Weight Sex Type Anes PTL Lv  4 Current           3 Term 12/27/19 [redacted]w[redacted]d 02:49 / 00:03 3040 g F Vag-Spont EPI  LIV  2 Term 11/19/17 [redacted]w[redacted]d / 00:04 2645 g F Vag-Spont EPI  LIV     Birth Comments: iugr  1 Term 03/24/16 [redacted]w[redacted]d 17:48 / 06:35 3280 g M Vag-Vacuum EPI  LIV   Past Surgical History:  Procedure Laterality Date   APPENDECTOMY  05/06/2015   laproscopic   LAPAROSCOPIC APPENDECTOMY N/A 05/06/2015   Procedure: APPENDECTOMY LAPAROSCOPIC;  Surgeon: Violeta Gelinas, MD;  Location: [facility] OR;  Service: General;  Laterality: N/A;   NO PAST SURGERIES     PERINEAL LACERATION REPAIR N/A 06/12/2016   Procedure: Revision/REPAIR of Postpartum PERINEAL Dehiscence;  Surgeon: [name], MD;  Location: [facility] ORS;  Service: Gynecology;  Laterality: N/A;   Family History  Problem Relation Age of Onset   Cancer Mother    Miscarriages / Stillbirths Mother    Endometriosis Mother    Cancer Maternal Grandmother    Hypertension Maternal Grandmother    Heart disease Maternal Grandfather    Stroke Paternal Grandmother    Cancer Paternal Grandmother    Social History   Tobacco Use   Smoking status: Never   Smokeless tobacco: Never  Vaping Use   Vaping status: Never Used  Substance Use Topics   Alcohol use: Not  Currently    Comment: occassionally   Drug use: No   No Known Allergies Medications Prior to Admission  Medication Sig Dispense Refill Last Dose/Taking   Prenatal Vit-Fe Fumarate-FA (PRENATAL MULTIVITAMIN) TABS tablet Take 1 tablet by mouth daily at 12 noon.   Past Week   acetaminophen (TYLENOL) 325 MG tablet Take 2 tablets (650 mg total) by mouth every 4 (four) hours as needed (for pain scale < 4). (Patient taking differently: Take 500 mg by mouth every 4 (four) hours as needed (for pain scale < 4).)      benzocaine-Menthol (DERMOPLAST) 20-0.5 % AERO Apply 1 application topically as needed for irritation (perineal discomfort).      coconut oil OIL Apply 1 application topically as needed.  0    docusate sodium (COLACE) 50 MG capsule Take 50 mg by mouth 2 (two) times daily.      ferrous sulfate 325 (65 FE) MG tablet Take 1 tablet (325 mg total) by mouth 2 (two) times daily with a meal. 60 tablet 3    guaiFENesin (MUCINEX) 600 MG 12 hr tablet Take 600 mg by mouth 2 (two) times daily as needed for to loosen phlegm.  magnesium oxide (MAG-OX) 400 (241.3 Mg) MG tablet Take 1 tablet (400 mg total) by mouth daily. 30 tablet 1    oseltamivir (TAMIFLU) 75 MG capsule Take 1 capsule (75 mg total) by mouth every 12 (twelve) hours. 9 capsule 0    potassium chloride (KLOR-CON M) 10 MEQ tablet Take 1 tablet (10 mEq total) by mouth 2 (two) times daily. 10 tablet 0    witch hazel-glycerin (TUCKS) pad Apply 1 application topically as needed for hemorrhoids. 40 each 12     I have reviewed patient's Past Medical Hx, Surgical Hx, Family Hx, Social Hx, medications and allergies.   ROS  Pertinent items noted in HPI and remainder of comprehensive ROS otherwise negative.   PHYSICAL EXAM  Patient Vitals for the past 24 hrs:  BP Temp Temp src Pulse Resp SpO2 Height Weight  09/12/23 0756 (!) 100/59 -- -- 75 -- -- -- --  09/12/23 0640 -- -- -- -- -- 98 % -- --  09/12/23 0617 (!) 103/57 97.8 F (36.6 C) Oral 100  18 99 % 5\' 3"  (1.6 m) 57 kg    Constitutional: Well-developed, well-nourished female in no acute distress.  Cardiovascular: normal rate & rhythm, warm and well-perfused Respiratory: normal effort, no problems with respiration noted GI: Abd soft, non-tender, gravid MS: Extremities nontender, no edema, normal ROM Neurologic: Alert and oriented x 4.  GU: no CVA tenderness Pelvic: NEFG, Speculum with RN Chaperone present: Scant blood in vaginal vault, no pooling, no active Bleeding from the OS and cervix visually closed     Fetal Tracing: Baseline:130 Variability: moderate Accelerations: present Decelerations: absent Toco: no ctx   Labs: Results for orders placed or performed during the hospital encounter of 09/12/23 (from the past 24 hours)  Urinalysis, Routine w reflex microscopic -Urine, Random     Status: Abnormal   Collection Time: 09/12/23  6:20 AM  Result Value Ref Range   Color, Urine YELLOW YELLOW   APPearance CLEAR CLEAR   Specific Gravity, Urine 1.015 1.005 - 1.030   pH 6.0 5.0 - 8.0   Glucose, UA NEGATIVE NEGATIVE mg/dL   Hgb urine dipstick MODERATE (A) NEGATIVE   Bilirubin Urine NEGATIVE NEGATIVE   Ketones, ur 5 (A) NEGATIVE mg/dL   Protein, ur NEGATIVE NEGATIVE mg/dL   Nitrite NEGATIVE NEGATIVE   Leukocytes,Ua NEGATIVE NEGATIVE   RBC / HPF 21-50 0 - 5 RBC/hpf   WBC, UA 0-5 0 - 5 WBC/hpf   Bacteria, UA NONE SEEN NONE SEEN   Squamous Epithelial / HPF 0-5 0 - 5 /HPF   Mucus PRESENT   CBC     Status: Abnormal   Collection Time: 09/12/23  6:30 AM  Result Value Ref Range   WBC 6.2 4.0 - 10.5 K/uL   RBC 3.20 (L) 3.87 - 5.11 MIL/uL   Hemoglobin 10.6 (L) 12.0 - 15.0 g/dL   HCT 40.9 (L) 81.1 - 91.4 %   MCV 94.4 80.0 - 100.0 fL   MCH 33.1 26.0 - 34.0 pg   MCHC 35.1 30.0 - 36.0 g/dL   RDW 78.2 95.6 - 21.3 %   Platelets 172 150 - 400 K/uL   nRBC 0.0 0.0 - 0.2 %    Imaging:  No results found.  MDM & MAU COURSE  MDM:  HIGH  O Positive Blood Type CBC: Anemia  10.6/30.2- ( RX Sent)  SIUP  ultrasound c/w complete previa Speculum exam performed  NST Reactive for GA    I have reviewed the patient chart  and performed the physical exam . I have ordered & interpreted the lab results and reviewed and interpreted the NST and images from ultrasound with Complete placenta Previa  Medications ordered as stated below.  A/P as described below.  Counseling and education provided and patient agreeable  with plan as described below. Verbalized understanding.     MAU Course: Orders Placed This Encounter  Procedures   [place] MFM OB Limited   CBC   Urinalysis, Routine w reflex microscopic -Urine, Random   Discharge patient Discharge disposition: 01-Home or Self Care; Discharge patient date: 09/12/2023   Meds ordered this encounter  Medications   ferrous sulfate 325 (65 FE) MG tablet    Sig: Take 1 tablet (325 mg total) by mouth every other day.    Dispense:  15 tablet    Refill:  2    Supervising Provider:   Reva Bores [[id number]]    ASSESSMENT   1. Bleeding   2. Placenta previa in third trimester   3. [redacted] weeks gestation of pregnancy     PLAN  Discharge home in stable condition with strict return precautions.  Close OB Follow Up  Bleeding precautions Pelvic Rest     Allergies as of 09/12/2023   No Known Allergies      Medication List     TAKE these medications    acetaminophen 325 MG tablet Commonly known as: Tylenol Take 2 tablets (650 mg total) by mouth every 4 (four) hours as needed (for pain scale < 4). What changed: how much to take   benzocaine-Menthol 20-0.5 % Aero Commonly known as: DERMOPLAST Apply 1 application topically as needed for irritation (perineal discomfort).   coconut oil Oil Apply 1 application topically as needed.   docusate sodium 50 MG capsule Commonly known as: COLACE Take 50 mg by mouth 2 (two) times daily.   ferrous sulfate 325 (65 FE) MG tablet Take 1 tablet (325 mg total) by mouth 2 (two) times daily with  a meal. What changed: Another medication with the same name was added. Make sure you understand how and when to take each.   ferrous sulfate 325 (65 FE) MG tablet Take 1 tablet (325 mg total) by mouth every other day. What changed: You were already taking a medication with the same name, and this prescription was added. Make sure you understand how and when to take each.   guaiFENesin 600 MG 12 hr tablet Commonly known as: MUCINEX Take 600 mg by mouth 2 (two) times daily as needed for to loosen phlegm.   magnesium oxide 400 (241.3 Mg) MG tablet Commonly known as: MAG-OX Take 1 tablet (400 mg total) by mouth daily.   oseltamivir 75 MG capsule Commonly known as: TAMIFLU Take 1 capsule (75 mg total) by mouth every 12 (twelve) hours.   potassium chloride 10 MEQ tablet Commonly known as: KLOR-CON M Take 1 tablet (10 mEq total) by mouth 2 (two) times daily.   prenatal multivitamin Tabs tablet Take 1 tablet by mouth daily at 12 noon.   witch hazel-glycerin pad Commonly known as: TUCKS Apply 1 application topically as needed for hemorrhoids.        Marcell Barlow, MSN, [facility], [facility] [employer]

## 2023-09-12 NOTE — Discharge Instructions (Signed)
 Pelvic Rest  Bleeding precautions

## 2023-09-23 ENCOUNTER — Other Ambulatory Visit: Payer: Self-pay

## 2023-09-23 ENCOUNTER — Inpatient Hospital Stay (HOSPITAL_COMMUNITY)
Admission: AD | Admit: 2023-09-23 | Discharge: 2023-09-23 | Disposition: A | Discharge status: Home / Self Care | Payer: Self-pay | Attending: Obstetrics & Gynecology | Admitting: Obstetrics & Gynecology

## 2023-09-23 ENCOUNTER — Inpatient Hospital Stay (HOSPITAL_COMMUNITY)

## 2023-09-23 ENCOUNTER — Encounter (HOSPITAL_COMMUNITY): Payer: Self-pay | Admitting: Obstetrics & Gynecology

## 2023-09-23 DIAGNOSIS — O4400 Placenta previa specified as without hemorrhage, unspecified trimester: Secondary | ICD-10-CM

## 2023-09-23 DIAGNOSIS — N939 Abnormal uterine and vaginal bleeding, unspecified: Secondary | ICD-10-CM | POA: Diagnosis not present

## 2023-09-23 DIAGNOSIS — O4413 Placenta previa with hemorrhage, third trimester: Secondary | ICD-10-CM

## 2023-09-23 DIAGNOSIS — Z3A30 30 weeks gestation of pregnancy: Secondary | ICD-10-CM | POA: Diagnosis not present

## 2023-09-23 DIAGNOSIS — O4403 Placenta previa specified as without hemorrhage, third trimester: Secondary | ICD-10-CM

## 2023-09-23 DIAGNOSIS — N898 Other specified noninflammatory disorders of vagina: Secondary | ICD-10-CM | POA: Diagnosis present

## 2023-09-23 DIAGNOSIS — O26853 Spotting complicating pregnancy, third trimester: Secondary | ICD-10-CM | POA: Diagnosis not present

## 2023-09-23 DIAGNOSIS — O4693 Antepartum hemorrhage, unspecified, third trimester: Secondary | ICD-10-CM | POA: Diagnosis present

## 2023-09-23 DIAGNOSIS — R1084 Generalized abdominal pain: Secondary | ICD-10-CM | POA: Diagnosis present

## 2023-09-23 LAB — URINALYSIS, ROUTINE W REFLEX MICROSCOPIC
Bilirubin Urine: NEGATIVE
Glucose, UA: NEGATIVE mg/dL
Hgb urine dipstick: NEGATIVE
Ketones, ur: NEGATIVE mg/dL
Leukocytes,Ua: NEGATIVE
Nitrite: NEGATIVE
Protein, ur: NEGATIVE mg/dL
Specific Gravity, Urine: 1.02 (ref 1.005–1.030)
pH: 6 (ref 5.0–8.0)

## 2023-09-23 NOTE — MAU Note (Signed)
 Diane [name] is a 38 y.o. at [redacted]w[redacted]d here in MAU reporting: she's been having abdominal tightness and brown vaginal discharge that began a few hours ago.  Reports she has complete placenta previa.  Denies bright red VB and LOF.  Reports +FM.  LMP: NA Onset of complaint: today Pain score: 3 Vitals:   09/23/23 1801  BP: 102/61  Pulse: 82  Resp: 18  Temp: 97.9 F (36.6 C)  SpO2: 99%     FHT: 131 bpm  Lab orders placed from triage: UA

## 2023-09-23 NOTE — MAU Provider Note (Signed)
 Chief Complaint:  Vaginal Discharge and Tightness   Event Date/Time   First Provider Initiated Contact with Patient 09/23/23 1859      HPI: Diane [name] is a 37 y.o. G4P3003 at [redacted]w[redacted]d  who presents to maternity admissions reporting brown spotting and some abdominal tightness today with a known placenta previa.  She reports some brown discharge when wiping since 4:30 pm today.  Before then, she had some stringy white discharge with no itching, burning, or odor.   She reports good fetal movement, denies LOF, bright red vaginal bleeding, vaginal itching/burning, urinary symptoms, h/a, dizziness, n/v, or fever/chills.    HPI  Past Medical History: Past Medical History:  Diagnosis Date   Anemia    AFTER DELIVERY   History of UTI    Vaginal Pap smear, abnormal    2015    Past obstetric history: OB History  Gravida Para Term Preterm AB Living  4 3 3   3   SAB IAB Ectopic Multiple Live Births     0 3    # Outcome Date GA Lbr Len/2nd Weight Sex Type Anes PTL Lv  4 Current           3 Term 12/27/19 [redacted]w[redacted]d 02:49 / 00:03 3040 g F Vag-Spont EPI  LIV  2 Term 11/19/17 [redacted]w[redacted]d / 00:04 2645 g F Vag-Spont EPI  LIV     Birth Comments: iugr  1 Term 03/24/16 [redacted]w[redacted]d 17:48 / 06:35 3280 g M Vag-Vacuum EPI  LIV    Past Surgical History: Past Surgical History:  Procedure Laterality Date   APPENDECTOMY  05/06/2015   laproscopic   LAPAROSCOPIC APPENDECTOMY N/A 05/06/2015   Procedure: APPENDECTOMY LAPAROSCOPIC;  Surgeon: Violeta Gelinas, MD;  Location: [facility];  Service: General;  Laterality: N/A;   NO PAST SURGERIES     PERINEAL LACERATION REPAIR N/A 06/12/2016   Procedure: Revision/REPAIR of Postpartum PERINEAL Dehiscence;  Surgeon: [name], MD;  Location: [facility];  Service: Gynecology;  Laterality: N/A;    Family History: Family History  Problem Relation Age of Onset   Cancer Mother    Miscarriages / Stillbirths Mother    Endometriosis Mother    Cancer Maternal Grandmother    Hypertension  Maternal Grandmother    Heart disease Maternal Grandfather    Stroke Paternal Grandmother    Cancer Paternal Grandmother     Social History: Social History   Tobacco Use   Smoking status: Never   Smokeless tobacco: Never  Vaping Use   Vaping status: Never Used  Substance Use Topics   Alcohol use: Not Currently    Comment: occassionally   Drug use: No    Allergies: No Known Allergies  Meds:  Medications Prior to Admission  Medication Sig Dispense Refill Last Dose/Taking   docusate sodium (COLACE) 50 MG capsule Take 50 mg by mouth 2 (two) times daily.   09/22/2023 Evening   ferrous sulfate 325 (65 FE) MG tablet Take 1 tablet (325 mg total) by mouth every other day. 15 tablet 2 09/22/2023 Evening   Prenatal Vit-Fe Fumarate-FA (PRENATAL MULTIVITAMIN) TABS tablet Take 1 tablet by mouth daily at 12 noon.   09/22/2023 Evening   witch hazel-glycerin (TUCKS) pad Apply 1 application topically as needed for hemorrhoids. 40 each 12 Past Week   acetaminophen (TYLENOL) 325 MG tablet Take 2 tablets (650 mg total) by mouth every 4 (four) hours as needed (for pain scale < 4). (Patient taking differently: Take 500 mg by mouth every 4 (four) hours as needed (for  pain scale < 4).)      benzocaine-Menthol (DERMOPLAST) 20-0.5 % AERO Apply 1 application topically as needed for irritation (perineal discomfort).      coconut oil OIL Apply 1 application topically as needed.  0    ferrous sulfate 325 (65 FE) MG tablet Take 1 tablet (325 mg total) by mouth 2 (two) times daily with a meal. 60 tablet 3    guaiFENesin (MUCINEX) 600 MG 12 hr tablet Take 600 mg by mouth 2 (two) times daily as needed for to loosen phlegm.      magnesium oxide (MAG-OX) 400 (241.3 Mg) MG tablet Take 1 tablet (400 mg total) by mouth daily. 30 tablet 1    oseltamivir (TAMIFLU) 75 MG capsule Take 1 capsule (75 mg total) by mouth every 12 (twelve) hours. 9 capsule 0    potassium chloride (KLOR-CON M) 10 MEQ tablet Take 1 tablet (10 mEq  total) by mouth 2 (two) times daily. 10 tablet 0     ROS:  Review of Systems  Constitutional:  Negative for chills, fatigue and fever.  Eyes:  Negative for visual disturbance.  Respiratory:  Negative for shortness of breath.   Cardiovascular:  Negative for chest pain.  Gastrointestinal:  Negative for abdominal pain, nausea and vomiting.  Genitourinary:  Positive for vaginal bleeding and vaginal discharge. Negative for difficulty urinating, dysuria, flank pain, pelvic pain and vaginal pain.  Neurological:  Negative for dizziness and headaches.  Psychiatric/Behavioral: Negative.       I have reviewed patient's Past Medical Hx, Surgical Hx, Family Hx, Social Hx, medications and allergies.   Physical Exam  Patient Vitals for the past 24 hrs:  BP Temp Temp src Pulse Resp SpO2 Height Weight  09/23/23 1830 102/64 -- -- 90 -- 100 % -- --  09/23/23 1801 102/61 97.9 F (36.6 C) Oral 82 18 99 % -- --  09/23/23 1756 -- -- -- -- -- -- 5\' 2"  (1.575 m) 57.5 kg   Constitutional: Well-developed, well-nourished female in no acute distress.  Cardiovascular: normal rate Respiratory: normal effort GI: Abd soft, non-tender, gravid appropriate for gestational age.  MS: Extremities nontender, no edema, normal ROM Neurologic: Alert and oriented x 4.  GU: Neg CVAT.  PELVIC EXAM: Cervix pink, visually closed, with scant amount of brown discharge in cervical os, none in vaginal vault, no red bleeding noted, vaginal walls and external genitalia normal      FHT:  Baseline 135 , moderate variability, accelerations present, no decelerations Contractions: q 1.5-2 mins   Labs: Results for orders placed or performed during the hospital encounter of 09/23/23 (from the past 24 hours)  Urinalysis, Routine w reflex microscopic -Urine, Clean Catch     Status: Abnormal   Collection Time: 09/23/23  6:04 PM  Result Value Ref Range   Color, Urine YELLOW YELLOW   APPearance HAZY (A) CLEAR   Specific Gravity,  Urine 1.020 1.005 - 1.030   pH 6.0 5.0 - 8.0   Glucose, UA NEGATIVE NEGATIVE mg/dL   Hgb urine dipstick NEGATIVE NEGATIVE   Bilirubin Urine NEGATIVE NEGATIVE   Ketones, ur NEGATIVE NEGATIVE mg/dL   Protein, ur NEGATIVE NEGATIVE mg/dL   Nitrite NEGATIVE NEGATIVE   Leukocytes,Ua NEGATIVE NEGATIVE      Imaging:  [place] MFM OB Limited Result Date: 09/12/2023 ----------------------------------------------------------------------  OBSTETRICS REPORT                        (Signed Final 09/12/2023 11:12 am) ---------------------------------------------------------------------- Patient  Info  ID #:       [id number]                          D.O.B.:  [birth year] (37 yrs)(F)  Name:       Diane [name]                  Visit Date: 09/12/2023 06:59 am ---------------------------------------------------------------------- Performed By  Attending:        Noralee Space MD        Referred By:       [facility] MAU/Triage  Performed By:     Earley Brooke     [facility] ---------------------------------------------------------------------- Orders  #  Description                           Code        Ordered By  1  [place] MFM OB LIMITED                     [id number]    Marcell Barlow ----------------------------------------------------------------------  #  Order #                     Accession #                Episode #  1  [id number]                   [id number]                 [id number] ---------------------------------------------------------------------- Indications  Placenta previa with hemorrhage, third          O44.13  trimester  [redacted] weeks gestation of pregnancy                 Z3A.28 ---------------------------------------------------------------------- Fetal Evaluation  Num Of Fetuses:          1  Fetal Heart Rate(bpm):   129  Cardiac Activity:        Observed  Presentation:            Cephalic  Placenta:                Posterior previa   P. Cord Insertion:       Visualized  Amniotic Fluid  AFI FV:      Within normal limits  AFI Sum(cm)     %Tile       Largest Pocket(cm)  16.9            63          5.  RUQ(cm)       RLQ(cm)       LUQ(cm)        LLQ(cm)  3.6           4.5           5              3.8 ---------------------------------------------------------------------- Gestational Age  Clinical EDD:  28w 4d  EDD:   12/01/23  Best:          Eden Emms 4d     Det. By:  Clinical EDD             EDD:   12/01/23 ---------------------------------------------------------------------- Anatomy  Diaphragm:             Appears normal         Kidneys:                Appear normal  Stomach:               Appears normal, left   Bladder:                Appears normal                         sided ---------------------------------------------------------------------- Cervix Uterus Adnexa  Cervix  Length:            3.2  cm.  Normal appearance by transabdominal scan ---------------------------------------------------------------------- Impression  Patient presented to the MAU with vaginal bleeding.  A limited ultrasound study was performed. Amniotic fluid is  normal and good fetal activity is seen. Placenta is posterior  and extends to the internal os (Placenta previa). No  retroplacental hemorrhage is seen.  On transabdominal scan, the cervix looks long and closed. ----------------------------------------------------------------------                 Noralee Space, MD Electronically Signed Final Report   09/12/2023 11:12 am ----------------------------------------------------------------------   DG Chest 2 View Result Date: 08/27/2023 CLINICAL DATA:  Twenty-six weeks pregnant, acute upper respiratory infection EXAM: CHEST - 2 VIEW COMPARISON:  Prior chest x-ray 08/14/2023 FINDINGS: The lungs are clear and negative for focal airspace consolidation, pulmonary edema or suspicious pulmonary nodule. No pleural effusion or pneumothorax.  Cardiac and mediastinal contours are within normal limits. No acute fracture or lytic or blastic osseous lesions. The visualized upper abdominal bowel gas pattern is unremarkable. IMPRESSION: Normal chest x-ray. Electronically Signed   By: Malachy Moan M.D.   On: 08/27/2023 15:34    MAU Course/MDM: Orders Placed This Encounter  Procedures   [place] MFM OB Limited   Urinalysis, Routine w reflex microscopic -Urine, Clean Catch   Discharge patient Discharge disposition: 01-Home or Self Care; Discharge patient date: 09/23/2023    No orders of the defined types were placed in this encounter.    NST reviewed and reactive Scant bleeding on exam Cramping/irritability on exam initially, pt feeling some mild cramping but cervix visually closed Pt reports resolution of cramping while in MAU Keep scheduled appts in office, return to MAU as needed for emergencies Bleeding precautions given    Assessment: 1. Placenta previa affecting delivery   2. Vaginal spotting   3. [redacted] weeks gestation of pregnancy     Plan: Discharge home Labor precautions and fetal kick counts  Follow-up Information     [facility] Follow up.   Why: As scheduled Contact information: [address] [place] [address] [phone]         [facility] 1S Maternity Assessment Unit Follow up.   Specialty: Obstetrics and Gynecology Why: As needed for emergencies Contact information: [address] [place] [address] [phone]               Allergies as of 09/23/2023   No Known Allergies      Medication List     STOP taking these medications  guaiFENesin 600 MG 12 hr tablet Commonly known as: MUCINEX   magnesium oxide 400 (241.3 Mg) MG tablet Commonly known as: MAG-OX   oseltamivir 75 MG capsule Commonly known as: TAMIFLU       TAKE these medications    acetaminophen 325 MG tablet Commonly known as: Tylenol Take 2 tablets (650 mg total) by mouth every 4  (four) hours as needed (for pain scale < 4). What changed: how much to take   benzocaine-Menthol 20-0.5 % Aero Commonly known as: DERMOPLAST Apply 1 application topically as needed for irritation (perineal discomfort).   coconut oil Oil Apply 1 application topically as needed.   docusate sodium 50 MG capsule Commonly known as: COLACE Take 50 mg by mouth 2 (two) times daily.   ferrous sulfate 325 (65 FE) MG tablet Take 1 tablet (325 mg total) by mouth 2 (two) times daily with a meal.   ferrous sulfate 325 (65 FE) MG tablet Take 1 tablet (325 mg total) by mouth every other day.   potassium chloride 10 MEQ tablet Commonly known as: KLOR-CON M Take 1 tablet (10 mEq total) by mouth 2 (two) times daily.   prenatal multivitamin Tabs tablet Take 1 tablet by mouth daily at 12 noon.   witch hazel-glycerin pad Commonly known as: TUCKS Apply 1 application topically as needed for hemorrhoids.        Sharen Counter Certified Nurse-Midwife 09/23/2023 10:40 PM

## 2023-10-05 ENCOUNTER — Other Ambulatory Visit: Payer: Self-pay | Admitting: Obstetrics and Gynecology

## 2023-10-05 DIAGNOSIS — O99013 Anemia complicating pregnancy, third trimester: Secondary | ICD-10-CM

## 2023-10-07 ENCOUNTER — Non-Acute Institutional Stay (HOSPITAL_COMMUNITY)
Admission: RE | Admit: 2023-10-07 | Discharge: 2023-10-07 | Disposition: A | Discharge status: Home / Self Care | Source: Ambulatory Visit | Attending: Internal Medicine | Admitting: Internal Medicine

## 2023-10-07 DIAGNOSIS — Z3A00 Weeks of gestation of pregnancy not specified: Secondary | ICD-10-CM | POA: Insufficient documentation

## 2023-10-07 DIAGNOSIS — O99013 Anemia complicating pregnancy, third trimester: Secondary | ICD-10-CM | POA: Insufficient documentation

## 2023-10-07 DIAGNOSIS — D649 Anemia, unspecified: Secondary | ICD-10-CM | POA: Insufficient documentation

## 2023-10-07 MED ORDER — IRON SUCROSE 500 MG IVPB - SIMPLE MED
500.0000 mg | INTRAVENOUS | Status: DC
  Administered 2023-10-07: 500 mg via INTRAVENOUS
  Filled 2023-10-07: qty 500

## 2023-10-07 MED ORDER — DIPHENHYDRAMINE HCL 25 MG PO CAPS
25.0000 mg | ORAL_CAPSULE | ORAL | Status: DC
  Administered 2023-10-07: 25 mg via ORAL
  Filled 2023-10-07: qty 1

## 2023-10-07 MED ORDER — ACETAMINOPHEN 500 MG PO TABS
500.0000 mg | ORAL_TABLET | ORAL | Status: DC
  Administered 2023-10-07: 500 mg via ORAL
  Filled 2023-10-07: qty 1

## 2023-10-07 MED ORDER — SODIUM CHLORIDE 0.9 % IV SOLN: INTRAVENOUS | Status: DC | PRN

## 2023-10-07 NOTE — Progress Notes (Signed)
 PATIENT CARE CENTER NOTE   Diagnosis: Anemia complicating pregnancy in third trimester [O99.013]    Provider: [name], MD   Procedure: Venofer infusion    Note:  Patient received Venofer 500 mg infusion (dose # 1 of 2) via PIV. Pre-mediated patient with PO Tylenol and Benadryl per order. Patient tolerated infusion well with no adverse reaction. Vital signs stable. Discharge instructions given. Patient to come back in 2 weeks for next infusion and will schedule next appointment at the front desk. Patient alert, oriented and ambulatory at discharge.

## 2023-10-09 ENCOUNTER — Other Ambulatory Visit: Payer: Self-pay

## 2023-10-09 ENCOUNTER — Inpatient Hospital Stay (HOSPITAL_COMMUNITY)
Admission: AD | Admit: 2023-10-09 | Discharge: 2023-10-12 | DRG: 833 | Disposition: A | Discharge status: Home / Self Care | Attending: Obstetrics and Gynecology | Admitting: Obstetrics and Gynecology

## 2023-10-09 ENCOUNTER — Encounter (HOSPITAL_COMMUNITY): Payer: Self-pay | Admitting: Obstetrics and Gynecology

## 2023-10-09 ENCOUNTER — Inpatient Hospital Stay (HOSPITAL_COMMUNITY)

## 2023-10-09 DIAGNOSIS — O4403 Placenta previa specified as without hemorrhage, third trimester: Secondary | ICD-10-CM | POA: Diagnosis not present

## 2023-10-09 DIAGNOSIS — O09523 Supervision of elderly multigravida, third trimester: Secondary | ICD-10-CM | POA: Diagnosis not present

## 2023-10-09 DIAGNOSIS — Z3A32 32 weeks gestation of pregnancy: Secondary | ICD-10-CM

## 2023-10-09 DIAGNOSIS — O365930 Maternal care for other known or suspected poor fetal growth, third trimester, not applicable or unspecified: Secondary | ICD-10-CM

## 2023-10-09 DIAGNOSIS — O4413 Placenta previa with hemorrhage, third trimester: Principal | ICD-10-CM | POA: Diagnosis present

## 2023-10-09 DIAGNOSIS — O4410 Placenta previa with hemorrhage, unspecified trimester: Principal | ICD-10-CM

## 2023-10-09 DIAGNOSIS — O99013 Anemia complicating pregnancy, third trimester: Secondary | ICD-10-CM | POA: Diagnosis present

## 2023-10-09 LAB — COMPREHENSIVE METABOLIC PANEL WITH GFR
ALT: 13 U/L (ref 0–44)
AST: 20 U/L (ref 15–41)
Albumin: 3.1 g/dL — ABNORMAL LOW (ref 3.5–5.0)
Alkaline Phosphatase: 85 U/L (ref 38–126)
Anion gap: 9 (ref 5–15)
BUN: 5 mg/dL — ABNORMAL LOW (ref 6–20)
CO2: 22 mmol/L (ref 22–32)
Calcium: 9.3 mg/dL (ref 8.9–10.3)
Chloride: 104 mmol/L (ref 98–111)
Creatinine, Ser: 0.65 mg/dL (ref 0.44–1.00)
GFR, Estimated: >60 mL/min (ref >60)
Glucose, Bld: 83 mg/dL (ref 70–99)
Potassium: 3.5 mmol/L (ref 3.5–5.1)
Sodium: 135 mmol/L (ref 135–145)
Total Bilirubin: 0.6 mg/dL (ref 0.0–1.2)
Total Protein: 6 g/dL — ABNORMAL LOW (ref 6.5–8.1)

## 2023-10-09 LAB — CBC
HCT: 35.1 % — ABNORMAL LOW (ref 36.0–46.0)
Hemoglobin: 11.9 g/dL — ABNORMAL LOW (ref 12.0–15.0)
MCH: 33.2 pg (ref 26.0–34.0)
MCHC: 33.9 g/dL (ref 30.0–36.0)
MCV: 98 fL (ref 80.0–100.0)
Platelets: 227 10*3/uL (ref 150–400)
RBC: 3.58 MIL/uL — ABNORMAL LOW (ref 3.87–5.11)
RDW: 14.6 % (ref 11.5–15.5)
WBC: 6.9 10*3/uL (ref 4.0–10.5)
nRBC: 0 % (ref 0.0–0.2)

## 2023-10-09 LAB — TYPE AND SCREEN
ABO/RH(D): O POS
Antibody Screen: NEGATIVE

## 2023-10-09 LAB — RPR: RPR Ser Ql: NONREACTIVE

## 2023-10-09 MED ORDER — LACTATED RINGERS IV BOLUS
1000.0000 mL | Freq: Once | INTRAVENOUS | Status: AC
  Administered 2023-10-09: 1000 mL via INTRAVENOUS

## 2023-10-09 MED ORDER — DOCUSATE SODIUM 100 MG PO CAPS
100.0000 mg | ORAL_CAPSULE | Freq: Every day | ORAL | Status: DC
  Administered 2023-10-09 – 2023-10-12 (×4): 100 mg via ORAL
  Filled 2023-10-09 (×4): qty 1

## 2023-10-09 MED ORDER — LACTATED RINGERS IV SOLN: 125.0000 mL/h | INTRAVENOUS | Status: AC

## 2023-10-09 MED ORDER — PRENATAL MULTIVITAMIN CH
1.0000 | ORAL_TABLET | Freq: Every day | ORAL | Status: DC
  Administered 2023-10-09 – 2023-10-11 (×3): 1 via ORAL
  Filled 2023-10-09 (×3): qty 1

## 2023-10-09 MED ORDER — BETAMETHASONE SOD PHOS & ACET 6 (3-3) MG/ML IJ SUSP
12.0000 mg | INTRAMUSCULAR | Status: AC
  Administered 2023-10-09 – 2023-10-10 (×2): 12 mg via INTRAMUSCULAR
  Filled 2023-10-09: qty 5

## 2023-10-09 MED ORDER — ACETAMINOPHEN 325 MG PO TABS
650.0000 mg | ORAL_TABLET | ORAL | Status: DC | PRN
  Administered 2023-10-10 – 2023-10-11 (×2): 650 mg via ORAL
  Filled 2023-10-09 (×2): qty 2

## 2023-10-09 MED ORDER — CALCIUM CARBONATE ANTACID 500 MG PO CHEW: 2.0000 | CHEWABLE_TABLET | ORAL | Status: DC | PRN

## 2023-10-09 NOTE — H&P (Signed)
 Diane [name] is a 38 y.o. [phone] at 104w3d gestation presents for complaint of Vaginal bleeding. Episode of bleeding this am about 0800. Noticed red blood in her underwear, then sat on toilet and had further drips of blood. Small clot noted during speculum exam in MAU. Patient denies any further bleeding. Felt some cramping last night, did not feel like contractions - not regular, not painful. Patient has had a prior episode of red spotting -slight in a liner.  No lof. +fm. Patient does have procardia at home prn ctx but did not take last night. Did take a week ago but didn't feel like it helped. Currently not feeling contractions, feeling mild tightening suprapubic area while we are speaking though hadn't felt anything for a while prior to this.  Antepartum course: growth restriction -new diagnosis this week with EFW 6%, AC 4%, nml UAD, bpp 8/8 [facility] since 10 wks.  See complete pre-natal records  History OB History     Gravida  4   Para  3   Term  3   Preterm      AB      Living  3      SAB      IAB      Ectopic      Multiple  0   Live Births  3          Past Medical History:  Diagnosis Date   Anemia    AFTER DELIVERY   History of UTI    Vaginal Pap smear, abnormal    2015   Past Surgical History:  Procedure Laterality Date   APPENDECTOMY  05/06/2015   laproscopic   LAPAROSCOPIC APPENDECTOMY N/A 05/06/2015   Procedure: APPENDECTOMY LAPAROSCOPIC;  Surgeon: Violeta Gelinas, MD;  Location: [facility] OR;  Service: General;  Laterality: N/A;   NO PAST SURGERIES     PERINEAL LACERATION REPAIR N/A 06/12/2016   Procedure: Revision/REPAIR of Postpartum PERINEAL Dehiscence;  Surgeon: [name], MD;  Location: [facility];  Service: Gynecology;  Laterality: N/A;   Family History: family history includes Cancer in her maternal grandmother, mother, and paternal grandmother; Endometriosis in her mother; Heart disease in her maternal grandfather; Hypertension  in her maternal grandmother; Miscarriages / [place] in her mother; Stroke in her paternal grandmother. Social History:  reports that she has never smoked. She has never used smokeless tobacco. She reports that she does not currently use alcohol. She reports that she does not use drugs.  ROS: See above otherwise negative  Prenatal labs:  ABO, Rh: --/--/O POS (04/05 2956) Antibody: NEG (04/05 0953) Rubella:  immune RPR:   non-reactive HBsAg:   neg HIV:  non-reactive GBS:   unknown 1 hr Glucola: Normal Genetic screening: Normal Anatomy US: Normal  Physical Exam:     Blood pressure (!) 82/43, pulse 69, temperature 98.2 F (36.8 C), temperature source Oral, resp. rate 18, height 5\' 3"  (1.6 m), weight 57.9 kg, SpO2 99%, unknown if currently breastfeeding. A&O x 3 HEENT: Normal Lungs: CTAB CV: RRR Abdominal: Soft, Non-tender, and Gravid Lower Extremities: Non-edematous, Non-tender  Pelvic Exam:      Deferred, see exam by MAU provider  Labs:  CBC:  Lab Results  Component Value Date   WBC 6.9 10/09/2023   RBC 3.58 (L) 10/09/2023   HGB 11.9 (L) 10/09/2023   HCT 35.1 (L) 10/09/2023   MCV 98.0 10/09/2023   MCH 33.2 10/09/2023   MCHC 33.9 10/09/2023   RDW 14.6 10/09/2023  PLT 227 10/09/2023   CMP:  Lab Results  Component Value Date   NA 135 10/09/2023   K 3.5 10/09/2023   CL 104 10/09/2023   CO2 22 10/09/2023   GLUCOSE 83 10/09/2023   BUN 5 (L) 10/09/2023   CREATININE 0.65 10/09/2023   CALCIUM 9.3 10/09/2023   PROT 6.0 (L) 10/09/2023   AST 20 10/09/2023   ALT 13 10/09/2023   ALBUMIN 3.1 (L) 10/09/2023   ALKPHOS 85 10/09/2023   BILITOT 0.6 10/09/2023   GFRNONAA >60 10/09/2023   GFRAA >60 05/07/2015   ANIONGAP 9 10/09/2023   Urine: Lab Results  Component Value Date   COLORURINE YELLOW 09/23/2023   APPEARANCEUR HAZY (A) 09/23/2023   LABSPEC 1.020 09/23/2023   PHURINE 6.0 09/23/2023   GLUCOSEU NEGATIVE 09/23/2023   HGBUR NEGATIVE 09/23/2023    BILIRUBINUR NEGATIVE 09/23/2023   KETONESUR NEGATIVE 09/23/2023   PROTEINUR NEGATIVE 09/23/2023   NITRITE NEGATIVE 09/23/2023   LEUKOCYTESUR NEGATIVE 09/23/2023     Prenatal Transfer Tool  Maternal Diabetes: No Genetic Screening: Normal Maternal Ultrasounds/Referrals: normal anatomy, IUGR noted this week Fetal Ultrasounds or other Referrals:  None Maternal Substance Abuse:  No Significant Maternal Medications:  None Significant Maternal Lab Results: None Number of Prenatal Visits:greater than 3 verified prenatal visits Other Comments:  None  FHT: 120s, nml variability, +accels, no decels Toco: irreg ctx, now resolved  U/s (prelim): cephalic, posterior previa, fht 142, afi wnl, largest pocket 3.5cm; efw 11%  Assessment/Plan:  38 y.o. G4P3003 at [redacted]w[redacted]d gestation   Placenta previa - admit for observation; iv hydration; NPO now but if stable then ok to eat Prematurity - s/p bmz x1 and plan to have second tomorrow; hold on NICU consult unless delivery indicated  IUGR diagnosed 4/1 on office u/s with nml afi and UAD, bpp 8/8; efw today 11% Fetal status reassuring - contin monitoring 5.   MOD - c/s if nrfht or worsening bleeding  Vick Frees 10/09/2023, 1:06 PM

## 2023-10-09 NOTE — MAU Provider Note (Signed)
 Chief Complaint:  Vaginal Bleeding   HPI   Diane [name] is a 38 y.o. [phone] at [redacted]w[redacted]d who presents to maternity admissions reporting that last night she had difficulty sleeping and was having some cramping. This morning she noticed at ~ 0800 she had red vaginal bleeding which was more then she had seen in the past. She still has some mild cramping. She is a known placenta previa and this is her third episode of vaginal spotting. She denies any recent pelvic exams and has been on pelvic rest and precautions for placenta previa.  She reports good FM's  Pregnancy Course: Chief Technology Officer  Past Medical History:  Diagnosis Date   Anemia    AFTER DELIVERY   History of UTI    Vaginal Pap smear, abnormal    2015   OB History  Gravida Para Term Preterm AB Living  4 3 3   3   SAB IAB Ectopic Multiple Live Births     0 3    # Outcome Date GA Lbr Len/2nd Weight Sex Type Anes PTL Lv  4 Current           3 Term 12/27/19 [redacted]w[redacted]d 02:49 / 00:03 3040 g F Vag-Spont EPI  LIV  2 Term 11/19/17 [redacted]w[redacted]d / 00:04 2645 g F Vag-Spont EPI  LIV     Birth Comments: iugr  1 Term 03/24/16 [redacted]w[redacted]d 17:48 / 06:35 3280 g M Vag-Vacuum EPI  LIV   Past Surgical History:  Procedure Laterality Date   APPENDECTOMY  05/06/2015   laproscopic   LAPAROSCOPIC APPENDECTOMY N/A 05/06/2015   Procedure: APPENDECTOMY LAPAROSCOPIC;  Surgeon: Violeta Gelinas, MD;  Location: [facility];  Service: General;  Laterality: N/A;   NO PAST SURGERIES     PERINEAL LACERATION REPAIR N/A 06/12/2016   Procedure: Revision/REPAIR of Postpartum PERINEAL Dehiscence;  Surgeon: [name], MD;  Location: [facility];  Service: Gynecology;  Laterality: N/A;   Family History  Problem Relation Age of Onset   Cancer Mother    Miscarriages / [place] Mother    Endometriosis Mother    Cancer Maternal Grandmother    Hypertension Maternal Grandmother    Heart disease Maternal Grandfather    Stroke Paternal Grandmother    Cancer Paternal Grandmother    Social  History   Tobacco Use   Smoking status: Never   Smokeless tobacco: Never  Vaping Use   Vaping status: Never Used  Substance Use Topics   Alcohol use: Not Currently    Comment: occassionally   Drug use: No   No Known Allergies Medications Prior to Admission  Medication Sig Dispense Refill Last Dose/Taking   acetaminophen (TYLENOL) 325 MG tablet Take 2 tablets (650 mg total) by mouth every 4 (four) hours as needed (for pain scale < 4). (Patient taking differently: Take 500 mg by mouth every 4 (four) hours as needed (for pain scale < 4).)   Past Week   cetirizine (ZYRTEC) 10 MG tablet Take 10 mg by mouth as needed for allergies.   Past Week   docusate sodium (COLACE) 50 MG capsule Take 50 mg by mouth 2 (two) times daily.   10/08/2023   ferrous sulfate 325 (65 FE) MG tablet Take 1 tablet (325 mg total) by mouth every other day. 15 tablet 2 Past Week   Prenatal Vit-Fe Fumarate-FA (PRENATAL MULTIVITAMIN) TABS tablet Take 1 tablet by mouth daily at 12 noon.   10/08/2023   benzocaine-Menthol (DERMOPLAST) 20-0.5 % AERO Apply 1 application topically as needed for irritation (perineal discomfort).  coconut oil OIL Apply 1 application topically as needed.  0    ferrous sulfate 325 (65 FE) MG tablet Take 1 tablet (325 mg total) by mouth 2 (two) times daily with a meal. 60 tablet 3    potassium chloride (KLOR-CON M) 10 MEQ tablet Take 1 tablet (10 mEq total) by mouth 2 (two) times daily. 10 tablet 0    witch hazel-glycerin (TUCKS) pad Apply 1 application topically as needed for hemorrhoids. 40 each 12     I have reviewed patient's Past Medical Hx, Surgical Hx, Family Hx, Social Hx, medications and allergies.   ROS  Pertinent items noted in HPI and remainder of comprehensive ROS otherwise negative.   PHYSICAL EXAM  Patient Vitals for the past 24 hrs:  BP Temp Temp src Pulse Resp SpO2 Height Weight  10/09/23 0835 105/60 98.3 F (36.8 C) Oral 77 16 99 % 5\' 3"  (1.6 m) 57.9 kg    Constitutional:  Well-developed, well-nourished female in no acute distress.  Cardiovascular: normal rate & rhythm, warm and well-perfused Respiratory: normal effort, no problems with respiration noted GI: Abd soft, non-tender, gravid MS: Extremities nontender, no edema, normal ROM Neurologic: Alert and oriented x 4.  GU: no CVA tenderness Pelvic: Sterile Speculum exam performed with RN Chaperone at the bedside. Their was a small amount of red vaginal bleeding in the vault and I manually removed a small clot with a cotton swab, no pooling of fluid, cervix appears visually closed.      Fetal Tracing: Cat 1 reactive at 0920 Baseline: 130 Variability: moderate  Accelerations: present Decelerations: absent Toco: occasional  ctx  with irritability noted   MDM & MAU COURSE  MDM:  HIGH - Admit for bleeding with Placenta Previa at 32.[redacted] wks GA  - I Spoke with Dr Wilburn Mylar ( OB Attending) and Dr Amado Nash at [facility] - Will admit to OBS, antenatal steroids, MFM complete sono with growth  and MFM consult  - Basic Ante admission orders were placed by me with antenatal steroids as dw Dr Amado Nash   - Dr Amado Nash will provide additional orders and consult with MFM.      ASSESSMENT   1. Vaginal bleeding in pregnant patient after first trimester with placenta previa   2. [redacted] weeks gestation of pregnancy     PLAN  As per Dr Amado Nash - Admit to OBS  --------------------------------------------- Marcell Barlow, MSN, [facility], [facility] for [facility]

## 2023-10-09 NOTE — MAU Note (Signed)
 Diane [name] is a 38 y.o. at [redacted]w[redacted]d here in MAU reporting: known placenta previa.  Was standing around 0800, felt the drips. Light amt on pad. Reports was uncomfortable during the night.  This is her first episode of bleeding.  Onset of complaint: 0800 Pain score: 2 Vitals:   10/09/23 0835  BP: 105/60  Pulse: 77  Resp: 16  Temp: 98.3 F (36.8 C)  SpO2: 99%     FHT:144, +FM reported Lab orders placed from triage:

## 2023-10-10 ENCOUNTER — Inpatient Hospital Stay (HOSPITAL_COMMUNITY)

## 2023-10-10 DIAGNOSIS — O365930 Maternal care for other known or suspected poor fetal growth, third trimester, not applicable or unspecified: Secondary | ICD-10-CM

## 2023-10-10 DIAGNOSIS — O09523 Supervision of elderly multigravida, third trimester: Secondary | ICD-10-CM | POA: Diagnosis not present

## 2023-10-10 DIAGNOSIS — O4403 Placenta previa specified as without hemorrhage, third trimester: Secondary | ICD-10-CM

## 2023-10-10 DIAGNOSIS — Z3A32 32 weeks gestation of pregnancy: Secondary | ICD-10-CM | POA: Diagnosis not present

## 2023-10-10 MED ORDER — NIFEDIPINE 10 MG PO CAPS
20.0000 mg | ORAL_CAPSULE | Freq: Three times a day (TID) | ORAL | Status: DC | PRN
  Administered 2023-10-10: 20 mg via ORAL
  Filled 2023-10-10: qty 2

## 2023-10-10 NOTE — Progress Notes (Signed)
 HD 2 - placenta previa with vaginal bleeding  Pt reports feeling some bleeding this am then passed a clot, about size of walnut, small amt of blood in pad with clot; no further bleeding; denies contractions, feels occasional tightening in her lower pelvis occasionally/mild and not painful  No lof; +FM  Patient Vitals for the past 24 hrs:  BP Temp Temp src Pulse Resp SpO2  10/10/23 0747 (!) 91/48 97.9 F (36.6 C) Oral 72 16 99 %  10/09/23 2317 (!) 85/51 (P) 98.5 F (36.9 C) (P) Oral 71 (P) 16 (P) 99 %  10/09/23 2013 (!) 92/54 97.6 F (36.4 C) Oral 85 16 97 %  10/09/23 1620 (!) 93/55 98.6 F (37 C) Oral 78 17 98 %  10/09/23 1135 (!) 82/43 98.2 F (36.8 C) Oral 69 18 99 %  10/09/23 0959 108/62 -- -- 72 16 --  10/09/23 0925 -- -- -- -- -- 99 %  10/09/23 0850 (!) 100/58 -- -- 68 18 --   A&ox3 Nml respirations Abd: soft,nt,nd; gravid LE: no edema, nt bilat  FHT: 130s, nml variability, +accels, no decels TOCO: few ctx this am but not regular; yesterday afternoon and eve more uterine irritability with rare ctx  U/s: posterior placenta previa, fht 142, cephalic, afi 9.7cm; efw 3'3" 30%, nml doppler  A/P: 38 y/o [id number] at 32.4 wga Placenta previa with vaginal bleeding - bleeding this am; reviewed at length with pt and husband observation and need for observation at least until tomorrow, longer pending further bleeding; pt understands better restrictions with placenta previa; pt had breakfast already, reassess at lunch for meal - if no further bleeding then ok for lunch, if continues to bleed will make npo Rh pos Fetal status reassuring; will keep continuous monitoring since small clot/bleeding this am; plan bpp also Prematuity - second bmz due now MOD - for c/s if delivery indicated

## 2023-10-11 DIAGNOSIS — O4403 Placenta previa specified as without hemorrhage, third trimester: Secondary | ICD-10-CM | POA: Diagnosis present

## 2023-10-11 NOTE — Progress Notes (Signed)
 Diane [name] 38 y.o. [id number] at [redacted]w[redacted]d HD#3 admitted with VB in setting of placenta previa, currently stable  Subjective: Patient doing well this morning. Has husband and son at bedside. Did have some mild cramping/ctxs last night that she was rating 3-4/10 on pain scale. She did take 1 dose of procardia around 10PM and was able to sleep, no return of any regular or painful contractions since then. Has seen blood only on tissue with wiping, no additional clots or bleeding onto the pad since around 7AM yesterday. No LOF. Good FM throughout. Otherwise feeling well.  Objective: Vitals:   10/10/23 1559 10/10/23 1945 10/11/23 0247 10/11/23 0745  BP: (!) 90/55 (!) 95/53 (!) 87/48 (!) 92/49  Pulse: 83 75 75 88  Resp: 16 14 18 16   Temp: 98.4 F (36.9 C) 97.7 F (36.5 C) 97.7 F (36.5 C) 97.8 F (36.6 C)  TempSrc: Oral Oral Oral Oral  SpO2: 99% 96% 97% 98%  Weight:      Height:       Physical Exam: -General: AAO, NAD -Cardiovascular: RRR -Respiratory: normal effort -Abdomen: gravid uterus, non-tender -Extremities: no LE edema  CBC    Component Value Date/Time   WBC 6.9 10/09/2023 0953   RBC 3.58 (L) 10/09/2023 0953   HGB 11.9 (L) 10/09/2023 0953   HCT 35.1 (L) 10/09/2023 0953   PLT 227 10/09/2023 0953   MCV 98.0 10/09/2023 0953   MCH 33.2 10/09/2023 0953   MCHC 33.9 10/09/2023 0953   RDW 14.6 10/09/2023 0953   LYMPHSABS 0.4 (L) 08/14/2023 2036   MONOABS 0.3 08/14/2023 2036   EOSABS 0.0 08/14/2023 2036   BASOSABS 0.0 08/14/2023 2036   Fetal Monitoring: -S/p continuous monitoring since admission -EFM: Cat I baseline 130-140 bpm mod var +accels, -decels -Toco: most recently rare occ. Ctx noted, between 1900-2230 ctxs q 2-4 min at times  MFM [place] on 4/5: Cephalic complete post placenta previa, EFW 11% AC 16% normal fluid  Assessment & Plan: Diane [name] 38 y.o. [phone] at [redacted]w[redacted]d HD#3 admitted with VB in setting of placenta previa, now clinically stable  Placenta Previa -No  return of active VB in the past 24hrs, some scant old blood noted when using the bathroom but no new or active bleeding appreciated -Uterine irritability intermittently likely secondary to VB episode, s/p 1 dose of Procardia last night and responded well, no signs of labor, abruption, or infection, continue to monitor -S/p continuous EFM/Toco: stable so change EFM to q shift, RN instructed to place patient on EFM/Toco for any return of VB, CTXs, and to notify provider Prematurity -s/p BMZ 4/5-4/6 FGR vs SGA -S/p Growth [place] in office last week with EFW 6% and repeated on admission 4/5 EFW 11% with normal UAD, BPP 8/8, and reassuring FHR tracing Antepartum Care -Regular diet since stable -PNV daily -Bowel regimen, minimize any excessive strain -Modified bedrest   Discussed with patient recommendation for inpatient management for vaginal bleeding in setting of placenta previa. Given that this is her first episode of active bleeding, may consider discharge home on modified bedrest and close outpatient monitoring after 24-48 hours of no return of bleeding or painful contractions. Recommend observation today and may consider discharge home later today versus tomorrow.    Diane [name] A Diane [name] 10/11/23 11:20 AM

## 2023-10-12 MED ORDER — SODIUM CHLORIDE 0.9% FLUSH
3.0000 mL | Freq: Two times a day (BID) | INTRAVENOUS | Status: DC
  Administered 2023-10-12: 3 mL via INTRAVENOUS

## 2023-10-12 MED ORDER — NIFEDIPINE 10 MG PO CAPS: 10.0000 mg | ORAL_CAPSULE | Freq: Three times a day (TID) | ORAL | 0 refills | Status: DC

## 2023-10-12 NOTE — Plan of Care (Signed)
  Problem: Education: Goal: Knowledge of disease or condition will improve Outcome: Adequate for Discharge Goal: Knowledge of the prescribed therapeutic regimen will improve Outcome: Adequate for Discharge   Problem: Clinical Measurements: Goal: Complications related to the disease process, condition or treatment will be avoided or minimized Outcome: Adequate for Discharge   Problem: Health Behavior/Discharge Planning: Goal: Ability to manage health-related needs will improve Outcome: Adequate for Discharge   Problem: Clinical Measurements: Goal: Ability to maintain clinical measurements within normal limits will improve Outcome: Adequate for Discharge Goal: Will remain free from infection Outcome: Adequate for Discharge Goal: Diagnostic test results will improve Outcome: Adequate for Discharge Goal: Respiratory complications will improve Outcome: Adequate for Discharge Goal: Cardiovascular complication will be avoided Outcome: Adequate for Discharge   Problem: Elimination: Goal: Will not experience complications related to bowel motility Outcome: Adequate for Discharge Goal: Will not experience complications related to urinary retention Outcome: Adequate for Discharge   Problem: Safety: Goal: Ability to remain free from injury will improve Outcome: Adequate for Discharge   Problem: Skin Integrity: Goal: Risk for impaired skin integrity will decrease Outcome: Adequate for Discharge

## 2023-10-12 NOTE — Progress Notes (Incomplete)
 Diane [name] 38 y.o. [id number] at [redacted]w[redacted]d HD#3 admitted with VB in setting of placenta previa, currently stable  Subjective: Patient doing well this morning. Has husband and son at bedside. Did have some mild cramping/ctxs last night that she was rating 3-4/10 on pain scale. She did take 1 dose of procardia around 10PM and was able to sleep, no return of any regular or painful contractions since then. Has seen blood only on tissue with wiping, no additional clots or bleeding onto the pad since around 7AM yesterday. No LOF. Good FM throughout. Otherwise feeling well.  Objective: Vitals:   10/11/23 1921 10/12/23 0321 10/12/23 0347 10/12/23 0754  BP: (!) 93/52 (!) 80/37 (!) 90/52 (!) 91/49  Pulse: 74 68 69 78  Resp: 20  16 17   Temp: 98.1 F (36.7 C)  98.1 F (36.7 C) 98 F (36.7 C)  TempSrc: Oral  Oral Oral  SpO2:    99%  Weight:      Height:       Physical Exam: -General: AAO, NAD -Cardiovascular: RRR -Respiratory: normal effort -Abdomen: gravid uterus, non-tender -Extremities: no LE edema  CBC    Component Value Date/Time   WBC 6.9 10/09/2023 0953   RBC 3.58 (L) 10/09/2023 0953   HGB 11.9 (L) 10/09/2023 0953   HCT 35.1 (L) 10/09/2023 0953   PLT 227 10/09/2023 0953   MCV 98.0 10/09/2023 0953   MCH 33.2 10/09/2023 0953   MCHC 33.9 10/09/2023 0953   RDW 14.6 10/09/2023 0953   LYMPHSABS 0.4 (L) 08/14/2023 2036   MONOABS 0.3 08/14/2023 2036   EOSABS 0.0 08/14/2023 2036   BASOSABS 0.0 08/14/2023 2036   Fetal Monitoring: -S/p continuous monitoring since admission -EFM: Cat I baseline 130-140 bpm mod var +accels, -decels -Toco: most recently rare occ. Ctx noted, between 1900-2230 ctxs q 2-4 min at times  MFM [place] on 4/5: Cephalic complete post placenta previa, EFW 11% AC 16% normal fluid  Assessment & Plan: Diane [name] 38 y.o. [phone] at [redacted]w[redacted]d [phone] at [redacted]w[redacted]d HD#3 admitted with VB in setting of placenta previa, now clinically stable  Placenta Previa -No return of active VB in the past  24hrs, some scant old blood noted when using the bathroom but no new or active bleeding appreciated -Uterine irritability intermittently likely secondary to VB episode, s/p 1 dose of Procardia last night and responded well, no signs of labor, abruption, or infection, continue to monitor -S/p continuous EFM/Toco: stable so change EFM to q shift, RN instructed to place patient on EFM/Toco for any return of VB, CTXs, and to notify provider Prematurity -s/p BMZ 4/5-4/6 FGR vs SGA -S/p Growth [place] in office last week with EFW 6% and repeated on admission 4/5 EFW 11% with normal UAD, BPP 8/8, and reassuring FHR tracing Antepartum Care -Regular diet since stable -PNV daily -Bowel regimen, minimize any excessive strain -Modified bedrest   Discussed with patient recommendation for inpatient management for vaginal bleeding in setting of placenta previa. Given that this is her first episode of active bleeding, may consider discharge home on modified bedrest and close outpatient monitoring after 24-48 hours of no return of bleeding or painful contractions. Recommend observation today and may consider discharge home later today versus tomorrow.    Robley Fries 10/12/23 11:55 AM

## 2023-10-12 NOTE — Discharge Summary (Signed)
 Physician Discharge Summary  Patient ID: Diane [name] MRN: [id number] DOB/AGE: 01/22/1986 38 y.o.  Admit date: 10/09/2023 Discharge date: 10/12/2023  Admission Diagnoses:  Discharge Diagnoses:  Principal Problem:   Placenta previa antepartum in third trimester   Discharged Condition: {condition:18240}  Hospital Course: ***  Consults: {consultation:18241}  Significant Diagnostic Studies: {diagnostics:18242}  Treatments: {Tx:18249}  Discharge Exam: Blood pressure (!) 91/49, pulse 78, temperature 98 F (36.7 C), temperature source Oral, resp. rate 17, height 5\' 3"  (1.6 m), weight 57.9 kg, SpO2 99%, unknown if currently breastfeeding. {physical WUJW:1191478}  Disposition: Discharge disposition: 01-Home or Self Care       Discharge Instructions     Call MD for:   Complete by: As directed    Vaginal bleeding other than light brown discharge   Diet - low sodium heart healthy   Complete by: As directed    Driving Restrictions   Complete by: As directed    No driving except for doctor appointment   Increase activity slowly   Complete by: As directed    Lifting restrictions   Complete by: As directed    No more than 5 pounds   Sexual Activity Restrictions   Complete by: As directed    Until cleared by your doctor      Allergies as of 10/12/2023   No Known Allergies      Medication List     STOP taking these medications    benzocaine-Menthol 20-0.5 % Aero Commonly known as: DERMOPLAST   coconut oil Oil   ferrous sulfate 325 (65 FE) MG tablet   potassium chloride 10 MEQ tablet Commonly known as: KLOR-CON M   witch hazel-glycerin pad Commonly known as: TUCKS       TAKE these medications    acetaminophen 325 MG tablet Commonly known as: Tylenol Take 2 tablets (650 mg total) by mouth every 4 (four) hours as needed (for pain scale < 4). What changed: how much to take   cetirizine 10 MG tablet Commonly known as: ZYRTEC Take 10 mg by mouth as  needed for allergies.   docusate sodium 50 MG capsule Commonly known as: COLACE Take 50 mg by mouth 2 (two) times daily.   NIFEdipine 10 MG capsule Commonly known as: Procardia Take 1 capsule (10 mg total) by mouth 3 (three) times daily for 21 days.   prenatal multivitamin Tabs tablet Take 1 tablet by mouth daily at 12 noon.         SignedRobley Fries 10/12/2023, 12:00 PM

## 2023-10-21 ENCOUNTER — Non-Acute Institutional Stay (HOSPITAL_COMMUNITY)
Admission: RE | Admit: 2023-10-21 | Discharge: 2023-10-21 | Disposition: A | Discharge status: Home / Self Care | Source: Ambulatory Visit | Attending: Internal Medicine | Admitting: Internal Medicine

## 2023-10-21 DIAGNOSIS — Z3A00 Weeks of gestation of pregnancy not specified: Secondary | ICD-10-CM | POA: Insufficient documentation

## 2023-10-21 DIAGNOSIS — O99013 Anemia complicating pregnancy, third trimester: Secondary | ICD-10-CM | POA: Insufficient documentation

## 2023-10-21 MED ORDER — DIPHENHYDRAMINE HCL 25 MG PO CAPS
25.0000 mg | ORAL_CAPSULE | Freq: Once | ORAL | Status: DC
  Filled 2023-10-21: qty 1

## 2023-10-21 MED ORDER — SODIUM CHLORIDE 0.9 % IV SOLN: INTRAVENOUS | Status: DC | PRN

## 2023-10-21 MED ORDER — ACETAMINOPHEN 500 MG PO TABS
500.0000 mg | ORAL_TABLET | Freq: Once | ORAL | Status: AC
  Administered 2023-10-21: 500 mg via ORAL
  Filled 2023-10-21: qty 1

## 2023-10-21 MED ORDER — IRON SUCROSE 500 MG IVPB - SIMPLE MED
500.0000 mg | Freq: Once | INTRAVENOUS | Status: AC
  Administered 2023-10-21: 500 mg via INTRAVENOUS
  Filled 2023-10-21: qty 500

## 2023-10-21 NOTE — Progress Notes (Signed)
 PATIENT CARE CENTER NOTE     Diagnosis: Anemia complicating pregnancy in third trimester [O99.013]      Provider: Meriam Stamp, MD     Procedure: Venofer infusion      Note:  Patient received Venofer 500 mg infusion (dose # 2 of 2) via PIV. Pre-mediated patient with PO Tylenol and Benadryl per order. Patient tolerated infusion well with no adverse reaction. Vital signs stable. Printed AVS offered but patient refused. Patient alert, oriented and ambulatory at discharge. Discharged home with husband.

## 2023-10-24 ENCOUNTER — Inpatient Hospital Stay (HOSPITAL_COMMUNITY)

## 2023-10-24 ENCOUNTER — Encounter (HOSPITAL_COMMUNITY): Payer: Self-pay | Admitting: Obstetrics & Gynecology

## 2023-10-24 ENCOUNTER — Inpatient Hospital Stay (HOSPITAL_COMMUNITY)
Admission: AD | Admit: 2023-10-24 | Discharge: 2023-11-05 | DRG: 787 | Disposition: A | Discharge status: Home / Self Care | Payer: Self-pay | Attending: Obstetrics and Gynecology | Admitting: Obstetrics and Gynecology

## 2023-10-24 ENCOUNTER — Other Ambulatory Visit: Payer: Self-pay

## 2023-10-24 DIAGNOSIS — O99824 Streptococcus B carrier state complicating childbirth: Secondary | ICD-10-CM | POA: Diagnosis present

## 2023-10-24 DIAGNOSIS — O365930 Maternal care for other known or suspected poor fetal growth, third trimester, not applicable or unspecified: Secondary | ICD-10-CM | POA: Diagnosis present

## 2023-10-24 DIAGNOSIS — O4403 Placenta previa specified as without hemorrhage, third trimester: Secondary | ICD-10-CM | POA: Diagnosis not present

## 2023-10-24 DIAGNOSIS — Z3A36 36 weeks gestation of pregnancy: Secondary | ICD-10-CM | POA: Diagnosis not present

## 2023-10-24 DIAGNOSIS — O4413 Placenta previa with hemorrhage, third trimester: Secondary | ICD-10-CM

## 2023-10-24 DIAGNOSIS — Z3A34 34 weeks gestation of pregnancy: Secondary | ICD-10-CM

## 2023-10-24 DIAGNOSIS — O09523 Supervision of elderly multigravida, third trimester: Secondary | ICD-10-CM | POA: Diagnosis not present

## 2023-10-24 DIAGNOSIS — O4693 Antepartum hemorrhage, unspecified, third trimester: Secondary | ICD-10-CM

## 2023-10-24 DIAGNOSIS — O4410 Placenta previa with hemorrhage, unspecified trimester: Secondary | ICD-10-CM | POA: Diagnosis present

## 2023-10-24 DIAGNOSIS — D62 Acute posthemorrhagic anemia: Secondary | ICD-10-CM | POA: Diagnosis not present

## 2023-10-24 DIAGNOSIS — O4400 Placenta previa specified as without hemorrhage, unspecified trimester: Secondary | ICD-10-CM | POA: Diagnosis present

## 2023-10-24 DIAGNOSIS — O9081 Anemia of the puerperium: Secondary | ICD-10-CM | POA: Diagnosis not present

## 2023-10-24 DIAGNOSIS — Z8249 Family history of ischemic heart disease and other diseases of the circulatory system: Secondary | ICD-10-CM | POA: Diagnosis not present

## 2023-10-24 DIAGNOSIS — Z3A35 35 weeks gestation of pregnancy: Secondary | ICD-10-CM | POA: Diagnosis not present

## 2023-10-24 LAB — TYPE AND SCREEN
ABO/RH(D): O POS
Antibody Screen: NEGATIVE

## 2023-10-24 LAB — CBC
HCT: 29.4 % — ABNORMAL LOW (ref 36.0–46.0)
Hemoglobin: 10.1 g/dL — ABNORMAL LOW (ref 12.0–15.0)
MCH: 33.4 pg (ref 26.0–34.0)
MCHC: 34.4 g/dL (ref 30.0–36.0)
MCV: 97.4 fL (ref 80.0–100.0)
Platelets: 182 10*3/uL (ref 150–400)
RBC: 3.02 MIL/uL — ABNORMAL LOW (ref 3.87–5.11)
RDW: 13.8 % (ref 11.5–15.5)
WBC: 6.8 10*3/uL (ref 4.0–10.5)
nRBC: 0 % (ref 0.0–0.2)

## 2023-10-24 MED ORDER — LACTATED RINGERS IV SOLN: 125.0000 mL/h | INTRAVENOUS | Status: AC

## 2023-10-24 MED ORDER — DOCUSATE SODIUM 100 MG PO CAPS
100.0000 mg | ORAL_CAPSULE | Freq: Every day | ORAL | Status: DC
  Administered 2023-10-24 – 2023-11-02 (×10): 100 mg via ORAL
  Filled 2023-10-24 (×10): qty 1

## 2023-10-24 MED ORDER — ACETAMINOPHEN 325 MG PO TABS: 650.0000 mg | ORAL_TABLET | ORAL | Status: DC | PRN

## 2023-10-24 MED ORDER — PRENATAL MULTIVITAMIN CH
1.0000 | ORAL_TABLET | Freq: Every day | ORAL | Status: DC
  Administered 2023-10-25 – 2023-11-02 (×9): 1 via ORAL
  Filled 2023-10-24 (×9): qty 1

## 2023-10-24 MED ORDER — CALCIUM CARBONATE ANTACID 500 MG PO CHEW: 2.0000 | CHEWABLE_TABLET | ORAL | Status: DC | PRN

## 2023-10-24 MED ORDER — NIFEDIPINE 10 MG PO CAPS
10.0000 mg | ORAL_CAPSULE | Freq: Three times a day (TID) | ORAL | Status: DC
Start: 2023-10-24 — End: 2023-10-24

## 2023-10-24 NOTE — MAU Note (Signed)
.  Diane [name] is a 38 y.o. at [redacted]w[redacted]d here in MAU reporting: Vaginal bleeding from 0700-0800 this morning. 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 She reports no bleeding since 0800.  Reports +FM Denies LOF.   Onset of complaint:  Pain score: patient denies pain, but has what we would call an "irritable uterus"  There were no vitals filed for this visit.   Lab orders placed from triage:   none

## 2023-10-24 NOTE — MAU Provider Note (Signed)
 History     CSN: [id number]  Arrival date and time: 10/24/23 1037   Event Date/Time   First Provider Initiated Contact with Patient 10/24/23 1140      Chief Complaint  Patient presents with   Vaginal Bleeding   HPI [name] is a 38 y.o. year old [id number] female at [redacted]w[redacted]d weeks gestation who presents to MAU reporting she has a known complete placenta previa and started bleeding this morning from 0700-0800 this morning. She came here after she participated in an [place] egg hunt with her children. She reports no VB since 0800. She denies LOF. She reported some UI earlier, but none now. She reports good (+) FM. She was recently admitted for VB and received BMZ x 2 doses. She receives [facility] with [facility] (Dr. Harless Lien).    OB History     Gravida  4   Para  3   Term  3   Preterm      AB      Living  3      SAB      IAB      Ectopic      Multiple  0   Live Births  3           Past Medical History:  Diagnosis Date   Anemia    AFTER DELIVERY   History of UTI    Vaginal Pap smear, abnormal    2015    Past Surgical History:  Procedure Laterality Date   APPENDECTOMY  05/06/2015   laproscopic   LAPAROSCOPIC APPENDECTOMY N/A 05/06/2015   Procedure: APPENDECTOMY LAPAROSCOPIC;  Surgeon: Dorena Gander, MD;  Location: [facility] OR;  Service: General;  Laterality: N/A;   NO PAST SURGERIES     PERINEAL LACERATION REPAIR N/A 06/12/2016   Procedure: Revision/REPAIR of Postpartum PERINEAL Dehiscence;  Surgeon: Meriam Stamp, MD;  Location: [facility] ORS;  Service: Gynecology;  Laterality: N/A;    Family History  Problem Relation Age of Onset   Cancer Mother    Miscarriages / [place] Mother    Endometriosis Mother    Cancer Maternal Grandmother    Hypertension Maternal Grandmother    Heart disease Maternal Grandfather    Stroke Paternal Grandmother    Cancer Paternal Grandmother     Social History   Tobacco Use   Smoking status: Never   Smokeless  tobacco: Never  Vaping Use   Vaping status: Never Used  Substance Use Topics   Alcohol use: Not Currently    Comment: occassionally   Drug use: No    Allergies: No Known Allergies  Medications Prior to Admission  Medication Sig Dispense Refill Last Dose/Taking   acetaminophen  (TYLENOL ) 325 MG tablet Take 2 tablets (650 mg total) by mouth every 4 (four) hours as needed (for pain scale < 4). (Patient taking differently: Take 500 mg by mouth every 4 (four) hours as needed (for pain scale < 4).)      cetirizine (ZYRTEC) 10 MG tablet Take 10 mg by mouth as needed for allergies.      docusate sodium  (COLACE) 50 MG capsule Take 50 mg by mouth 2 (two) times daily.      NIFEdipine  (PROCARDIA ) 10 MG capsule Take 1 capsule (10 mg total) by mouth 3 (three) times daily for 21 days. 63 capsule 0    Prenatal Vit-Fe Fumarate-FA (PRENATAL MULTIVITAMIN) TABS tablet Take 1 tablet by mouth daily at 12 noon.       Review of Systems  Constitutional: Negative.  HENT: Negative.    Eyes: Negative.   Respiratory: Negative.    Cardiovascular: Negative.   Gastrointestinal: Negative.   Endocrine: Negative.   Genitourinary: Negative.  Negative for vaginal bleeding (bleeding from 0700-0800 this morning; none since 0800).  Musculoskeletal: Negative.   Allergic/Immunologic: Negative.   Neurological: Negative.   Hematological: Negative.   Psychiatric/Behavioral: Negative.     Physical Exam   Blood pressure (!) 116/98, pulse 84, temperature 98.2 F (36.8 C), temperature source Oral, resp. rate 16, height 5\' 2"  (1.575 m), weight 59.9 kg, SpO2 100%, unknown if currently breastfeeding.  Physical Exam Vitals and nursing note reviewed. Exam conducted with a chaperone present.  Constitutional:      Appearance: Normal appearance. She is normal weight.  Cardiovascular:     Rate and Rhythm: Normal rate.  Pulmonary:     Effort: Pulmonary effort is normal.  Abdominal:     Palpations: Abdomen is soft.   Genitourinary:    General: Normal vulva.     Comments: Pelvic exam: External genitalia normal, SE: vaginal walls pink and well rugated, cervix is smooth, pink, no lesions, small amt of mucoid like bright, re blood in vaginal vault -- cervix visually closed; cervical exam deferred. Musculoskeletal:        General: Normal range of motion.  Skin:    General: Skin is warm and dry.  Neurological:     Mental Status: She is alert and oriented to person, place, and time.  Psychiatric:        Mood and Affect: Mood normal.        Behavior: Behavior normal.        Thought Content: Thought content normal.        Judgment: Judgment normal.    REACTIVE NST - FHR: 125 bpm / moderate variability / accels present / decels absent / TOCO: rare UI noted  MAU Course  Procedures  MDM Speculum Exam OB MFM Limited U/S      Assessment and Plan  1. Placenta previa in third trimester (Primary) - Call to Dr. Ricky Charter at 1416 to notify of need for admission; agrees with plan  2. Vaginal bleeding in pregnancy, third trimester  3. [redacted] weeks gestation of pregnancy - Admit to [facility]  - Dr. Ricky Charter to enter orders in Epic - See Dr. Drew Gentleman H&P documentation  Almond Army, CNM 10/24/2023, 11:40 AM

## 2023-10-24 NOTE — H&P (Signed)
 HD #1 34.4 wks   [name] is a 38 y.o. year old G37P3003 female at [redacted]w[redacted]d weeks gestation is admitted from [facility]U for placenta previa with one brief episode of bleeding at home at 8 am. She noted in underwear, saturated but did not drip out of it, some in commode but not ongoing. She came to hospital as advised, declines UCs/ LOF. Feels good FMs. She came here after she participated in an [place] egg hunt with her children. She receives [facility] with [facility] (Dr. Harless Lien).    This is her 2nd admission for bleeding.  Her office sono noted IUGR but last sono here at admission on 10/09/23 - Cephalic, posterior previa, fht 142, afi wnl, largest pocket 3.5cm; efw 11%   OB History     Gravida  4   Para  3   Term  3   Preterm      AB      Living  3      SAB      IAB      Ectopic      Multiple  0   Live Births  3           Past Medical History:  Diagnosis Date   Anemia    AFTER DELIVERY   History of UTI    Vaginal Pap smear, abnormal    2015    Past Surgical History:  Procedure Laterality Date   APPENDECTOMY  05/06/2015   laproscopic   LAPAROSCOPIC APPENDECTOMY N/A 05/06/2015   Procedure: APPENDECTOMY LAPAROSCOPIC;  Surgeon: Dorena Gander, MD;  Location: [facility] OR;  Service: General;  Laterality: N/A;   NO PAST SURGERIES     PERINEAL LACERATION REPAIR N/A 06/12/2016   Procedure: Revision/REPAIR of Postpartum PERINEAL Dehiscence;  Surgeon: Meriam Stamp, MD;  Location: [facility];  Service: Gynecology;  Laterality: N/A;    Family History  Problem Relation Age of Onset   Cancer Mother    Miscarriages / [place] Mother    Endometriosis Mother    Cancer Maternal Grandmother    Hypertension Maternal Grandmother    Heart disease Maternal Grandfather    Stroke Paternal Grandmother    Cancer Paternal Grandmother     Social History   Tobacco Use   Smoking status: Never   Smokeless tobacco: Never  Vaping Use   Vaping status: Never Used  Substance Use  Topics   Alcohol use: Not Currently    Comment: occassionally   Drug use: No    Allergies: No Known Allergies  Medications Prior to Admission  Medication Sig Dispense Refill Last Dose/Taking   acetaminophen  (TYLENOL ) 325 MG tablet Take 2 tablets (650 mg total) by mouth every 4 (four) hours as needed (for pain scale < 4). (Patient taking differently: Take 500 mg by mouth every 4 (four) hours as needed (for pain scale < 4).)      cetirizine (ZYRTEC) 10 MG tablet Take 10 mg by mouth as needed for allergies.      docusate sodium  (COLACE) 50 MG capsule Take 50 mg by mouth 2 (two) times daily.      NIFEdipine  (PROCARDIA ) 10 MG capsule Take 1 capsule (10 mg total) by mouth 3 (three) times daily for 21 days. 63 capsule 0    Prenatal Vit-Fe Fumarate-FA (PRENATAL MULTIVITAMIN) TABS tablet Take 1 tablet by mouth daily at 12 noon.       Review of Systems  Constitutional: Negative.   HENT: Negative.    Eyes: Negative.  Respiratory: Negative.    Cardiovascular: Negative.   Gastrointestinal: Negative.   Endocrine: Negative.   Genitourinary:  Positive for vaginal bleeding.  Musculoskeletal: Negative.   Allergic/Immunologic: Negative.   Neurological: Negative.   Hematological: Negative.   Psychiatric/Behavioral: Negative.     Physical Exam   Blood pressure (!) 100/51, pulse 74, temperature 98.1 F (36.7 C), temperature source Oral, resp. rate 16, height 5\' 2"  (1.575 m), weight 59.9 kg, SpO2 100%, unknown if currently breastfeeding.  Physical Exam Vitals and nursing note reviewed. Exam conducted with a chaperone present.  Constitutional:      Appearance: Normal appearance. She is normal weight.  Cardiovascular:     Rate and Rhythm: Normal rate.  Pulmonary:     Effort: Pulmonary effort is normal.  Abdominal:     Palpations: Abdomen is soft.  Genitourinary:    General: Normal vulva.     Comments: Pelvic exam: External genitalia normal, SE: vaginal walls pink and well rugated, cervix is  smooth, pink, no lesions, small amt of mucoid like bright, re blood in vaginal vault -- cervix visually closed; cervical exam deferred. Musculoskeletal:        General: Normal range of motion.  Skin:    General: Skin is warm and dry.  Neurological:     Mental Status: She is alert and oriented to person, place, and time.  Psychiatric:        Mood and Affect: Mood normal.        Behavior: Behavior normal.        Thought Content: Thought content normal.        Judgment: Judgment normal.   REACTIVE NST - FHR: 125 bpm / moderate variability / accels present / decels absent - reactive  TOCO: no UCs    Limited sono - no abruption. Nl AFI   Prenatal Transfer Tool  Maternal Diabetes: No Genetic Screening: Normal Maternal Ultrasounds/Referrals: normal anatomy, IUGR noted in office but 11% growth on 10/09/23 in hospital Fetal Ultrasounds or other Referrals:  None Maternal Substance Abuse:  No Significant Maternal Medications:  None Significant Maternal Lab Results: None Number of Prenatal Visits:greater than 3 verified prenatal visits Other Comments:  None  Assessment and Plan  1. Placenta previa in third trimester with second bleed but minimal and is stable, no active bleeding, so expectant management now and proceed with delivery if recurs 2.  34.[redacted] weeks gestation of pregnancy. S/p BTMZ course at last admission, defer repeating. S/p NOCU consult at last admission.  SGA baby, last growth 2 wks back, so repeat not ordered  Delivery planning- can consider 35 wks-36 wks.  Remain inpatient Patient and husband counseled and all in agreement  Terri Fester MD

## 2023-10-25 MED ORDER — SODIUM CHLORIDE 0.9% FLUSH
3.0000 mL | Freq: Two times a day (BID) | INTRAVENOUS | Status: DC
  Administered 2023-10-25 – 2023-11-02 (×16): 3 mL via INTRAVENOUS

## 2023-10-25 NOTE — Progress Notes (Addendum)
 Antepartum Progress Note HD#2  S: [name] is a 38 y.o. year old [id number] female at [redacted]w[redacted]d weeks gestation admitted for placenta previa with one brief episode of bleeding at home at 8 am yesterday. This is second episode of bleeding this pregnancy.   Today, patient reports feeling overall well without complaints. Denies further vaginal bleeding, leakage of fluid, contractions, or decreased fetal movement.   O:  Vitals:   10/24/23 1952 10/25/23 0828  BP: (!) 100/59 (!) 91/49  Pulse: 84 73  Resp: 18 17  Temp: 97.8 F (36.6 C) 98 F (36.7 C)  SpO2: 98% 99%   PE: GA: well appearing, NAD Chest: normal work of breathing on room air Abd: soft, NT, gravid Perineum: no blood on pad Ext: no TTP  Labs:  Lab Results  Component Value Date   WBC 6.8 10/24/2023   HGB 10.1 (L) 10/24/2023   HCT 29.4 (L) 10/24/2023   MCV 97.4 10/24/2023   PLT 182 10/24/2023   EFM: baseline 125bpm, moderate variability, + accels, - decels (variable x1 last night @ 2240) Toco: no ctxs   A/P: 37yo [id number] at [redacted]w[redacted]d with placenta previa admitted for second bleed, light and now resolved. Vitals wnl, physical exam benign, labs reassuring with Hgb 10.1, FHT reassuring. S/p BMZ (4/5-6) and NICU consult on last admission. Plan 48h - 72h inpatient monitoring and delivery via PCS at 35-36w. H/o IUGR earlier in pregnancy, resolved on last scan 2 weeks ago. Plan to continue TID NSTs. Keep active T&S. Due for weekly BPP tomorrow.   Ellsworth Haas, MD

## 2023-10-26 ENCOUNTER — Inpatient Hospital Stay (HOSPITAL_COMMUNITY)

## 2023-10-26 DIAGNOSIS — O09523 Supervision of elderly multigravida, third trimester: Secondary | ICD-10-CM | POA: Diagnosis not present

## 2023-10-26 DIAGNOSIS — O4413 Placenta previa with hemorrhage, third trimester: Secondary | ICD-10-CM

## 2023-10-26 DIAGNOSIS — O365930 Maternal care for other known or suspected poor fetal growth, third trimester, not applicable or unspecified: Secondary | ICD-10-CM | POA: Diagnosis not present

## 2023-10-26 DIAGNOSIS — Z3A34 34 weeks gestation of pregnancy: Secondary | ICD-10-CM

## 2023-10-26 NOTE — Progress Notes (Signed)
 Antepartum Progress Note HD#3 34w 6d Previa- second bleed  S: [name] is a 38 y.o. year old G45P3003 female at [redacted]w[redacted]d weeks gestation admitted for placenta previa with one brief episode of bleeding at home at 8 am yesterday. This is second episode of bleeding this pregnancy.   Today, patient reports feeling overall well without complaints. Denies further vaginal bleeding, leakage of fluid, contractions, or decreased fetal movement. No bleeding x 48h.  O:  Vitals:   10/25/23 1524 10/25/23 1935  BP: (!) 89/46 (!) 100/58  Pulse: 76 76  Resp: 17 16  Temp: 97.6 F (36.4 C) 98 F (36.7 C)  SpO2: 100% 100%   PE: GA: well appearing, NAD Neck: supple Chest: normal work of breathing on room air CV:RRR Abd: soft, NT, gravid Perineum: no blood on pad Ext: no TTP Neuro: non focal Skin: intact  Labs:  Lab Results  Component Value Date   WBC 6.8 10/24/2023   HGB 10.1 (L) 10/24/2023   HCT 29.4 (L) 10/24/2023   MCV 97.4 10/24/2023   PLT 182 10/24/2023   EFM: baseline 125bpm, moderate variability, + accels, - decels (variable x1 last night @ 2240) Toco: no ctxs  Reactive NST x 3.  A/P: 37yo [id number] at [redacted]w[redacted]d with placenta previa admitted for second bleed, light and now resolved x 48h, FHT reassuring. S/p BMZ (4/5-6) and NICU consult on last admission.Reactive NST x 3. UI noted. No PTL FGR- fetal status reassuring. Bpp today. Anemia- fe infusion x 2 completed   Plan 72h inpatient monitoring and delivery via PCS at 35-36w. Plan to continue TID NSTs. Keep active T&S. BPP today Considering options of inpt management until delivery at 36w vs dc home tomorrow with csection at 36w vs delivery at 35w. Pt will discuss with husband and fu with decision today.   face to face and chart review.

## 2023-10-27 LAB — TYPE AND SCREEN
ABO/RH(D): O POS
Antibody Screen: NEGATIVE

## 2023-10-27 NOTE — Progress Notes (Signed)
 Diane [name] 38 y.o. [id number] at [redacted]w[redacted]d HD#4 re-admitted with 2nd VB episode in setting of placenta previa, currently stable without bleeding x 72 hours  Subjective: Patient doing well this morning. Her husband is at bedside. Denies any additional VB or spotting since admission. Yesterday had felt some minor abdominal discomforts but no contractions on monitor, today feeling very good. Good FM appreciated. No new concerns.  Objective: Vitals:   10/26/23 1645 10/26/23 1935 10/27/23 0612 10/27/23 0750  BP: (!) 97/55 (!) 102/57 (!) 89/47 (!) 92/45  Pulse: 76 77 70 73  Resp: 16 18 16 16   Temp: 98.3 F (36.8 C) 98.1 F (36.7 C) 98 F (36.7 C) 97.9 F (36.6 C)  TempSrc: Oral Oral Oral Oral  SpO2: 99% 100% 99% 99%  Weight:      Height:       Physical Exam: -General: AAO, NAD -Cardiovascular: RRR -Respiratory: normal effort -Abdomen: gravid uterus, non-tender -Extremities: no LE edema  CBC    Component Value Date/Time   WBC 6.8 10/24/2023 1752   RBC 3.02 (L) 10/24/2023 1752   HGB 10.1 (L) 10/24/2023 1752   HCT 29.4 (L) 10/24/2023 1752   PLT 182 10/24/2023 1752   MCV 97.4 10/24/2023 1752   MCH 33.4 10/24/2023 1752   MCHC 34.4 10/24/2023 1752   RDW 13.8 10/24/2023 1752   LYMPHSABS 0.4 (L) 08/14/2023 2036   MONOABS 0.3 08/14/2023 2036   EOSABS 0.0 08/14/2023 2036   BASOSABS 0.0 08/14/2023 2036   Fetal Monitoring: 4/22 PM NST ~2100 reactive baseline 120 bpm mod var +accels, -decels, acontractile on toco 4/23 AM NST ~0830 reactive baseline 130 bpm mod var +accels, -decels, acontractile on toco   MFM Growth US  on 4/5: Cephalic complete post placenta previa, EFW 11% AC 16%  MFM US  4/22: Cephalic BPP 8/8 post placenta previa  Assessment & Plan: Diane [name] 38 y.o. [id number] at [redacted]w[redacted]d HD#4 re-admission for VB in setting of placenta previa, now clinically stable  Placenta Previa -No return of active VB in the past 72 hours -Reassuring fetal status with yesterday's BPP 8/8 and  reactive NST TID Prematurity -s/p BMZ 4/5-4/6 FGR vs SGA -S/p Growth US  in office around 32 weeks EFW 6% and repeated on admission 4/5 EFW 11% with normal UAD, BPP 8/8, and reassuring FHR tracing Antepartum Care -Regular diet since stable -PNV daily -Bowel regimen, minimize any excessive strain -Modified bedrest   Patient has been thoroughly counseled by her primary OB provider Dr. Harless Lien on management options moving forward with inpatient versus outpatient risks and benefits. I again discussed with patient and her husband at bedside this morning these options. After thorough counseling patient has elected to remain inpatient until delivery at 36 weeks by primary cesarean section. Will continue routine antepartum inpatient care.   Diane [name] 10/27/23 11:32 AM

## 2023-10-28 NOTE — Progress Notes (Addendum)
 Hd 5 Placenta previa, 2nd bleed, 35.1 wga  No c/o; no bleeding, brown d/c with wiping once today; no ctx, lof +FM   Patient Vitals for the past 24 hrs:  BP Temp Temp src Pulse Resp SpO2  10/28/23 0817 (!) 95/48 97.8 F (36.6 C) Oral 73 16 100 %  10/28/23 0316 (!) 87/44 -- -- 67 -- --  10/28/23 0314 (!) 84/42 98.1 F (36.7 C) Oral 75 18 97 %  10/27/23 1943 103/66 97.6 F (36.4 C) Oral 83 18 97 %  10/27/23 1527 (!) 90/49 97.8 F (36.6 C) Oral 75 17 100 %  10/27/23 1140 (!) 98/56 98.2 F (36.8 C) Oral 77 16 98 %   A&ox3 Nml respirations Abd: soft,nt,nd, gravid LE: no edema,nt bilat  NST: 1530, 4/24 FHT:130s, nml variability, +accels, no decels TOCO: no ctx    2130, 4/24 FHT: 120s, nml variability, +accels, no decels TOCO: no ctx   FHT: 120s, nml variability, +accels, no decels TOCO: no ctx  MFM Growth US  on 4/5: Cephalic complete post placenta previa, EFW 11% AC 16%  MFM US  4/22: Cephalic BPP 8/8 post placenta previa  Type/screen last done 4/23; rh pos, ab neg  A/P: 38 y.o. G4P3003 at [redacted]w[redacted]d HD#5, re-admission for VB in setting of placenta previa, 2nd bleeding episode, stable   Placenta Previa - stable, reassuring fetal status; plan with primary provider for delivery at 36 wga unless change in maternal or fetal status, she has not yet heard the date/time for surgery, inpatient until delivery;  2.   Prematurity: s/p BMZ 4/5-4/6, s/p NICU consult FGR vs SGA -S/p Growth US  in office around 32 weeks EFW 6% and repeated on admission 4/5 EFW 11% with normal UAD, BPP 8/8; fetal status reassuring, continue tid monitoring, wkly bpp Antepartum Care:  -Regular diet since stable -PNV daily -Bowel regimen, minimize any excessive strain -Modified bedrest    face to face counseling and charting

## 2023-10-29 NOTE — Progress Notes (Addendum)
 0228-RN rounding on pt, pt in bed sleeping 0230-pt called out saying she was bleeding, RN to bedside to assess. Upon assessment, pt had scant/small amount on pad, about the same as what she had when she came in originally. Pt got up to use the bathroom, noted to have small amount of more bleeding upon first wipe, but none on second wipe. 0234-pt placed on fetal monitors 0235-Dr. Mody made aware, no new orders at this time

## 2023-10-29 NOTE — Progress Notes (Signed)
 HD #6  Placenta previa, readmit for 2nd bleed, 35.2 wga  S: one bleed, slight on pad, nothing continued after, this was at 2.30 am. RN called me and we monitored mom and FHT with no further events.  Pt reported feeling some cramps during the day but not since. +FM   Patient Vitals for the past 24 hrs:  BP Temp Temp src Pulse Resp SpO2  10/29/23 0811 (!) 91/48 98.2 F (36.8 C) Oral 74 18 100 %  10/28/23 2251 (!) 86/44 98.2 F (36.8 C) Oral 73 16 100 %  10/28/23 1936 (!) 95/55 98.1 F (36.7 C) Oral 85 16 100 %  10/28/23 1522 (!) 95/56 97.6 F (36.4 C) Oral 79 17 100 %  10/28/23 1146 (!) 100/56 98.4 F (36.9 C) Oral 88 17 99 %   A&ox3 Nml respirations Abd: soft,nt,nd, gravid, soft uterus LE: no edema,nt bilat  NST: 8 pm 4/24 FHT:120s, nml variability, +accels, no decels TOCO: no ctx    2.30 am 4/25 FHT: 120s, nml variability, +accels, no decels TOCO: no ctx   10 am 4/25 FHT: 120s, nml variability, +accels, no decels TOCO: no ctx  MFM Growth US  on 4/5: Cephalic complete post placenta previa, EFW 11% AC 16%  MFM US  4/22: Cephalic BPP 8/8 post placenta previa  Type/screen last done 4/23; rh pos, ab neg  A/P: 38 y.o. G4P3003 at [redacted]w[redacted]d HD# 6, re-admission for VB in setting of placenta previa, 2nd bleeding episode, stable   Placenta Previa - stable, with slight bleed overnight, Planning scheduled C/s on 4/30 per Dr Harless Lien, but t counseled it may be sooner if heavier bleed. Agrees. Reassuring fetal status, inpatient until delivery;  2.   Prematurity: s/p BMZ 4/5-4/6, s/p NICU consult FGR vs SGA -S/p Growth US  in office around 32 weeks EFW 6% and repeated on admission 4/5 EFW 11% with normal UAD, BPP 8/8; fetal status reassuring, continue tid monitoring, wkly bpp Antepartum Care:  -Regular diet since stable -PNV daily -Bowel regimen, minimize any excessive strain -Modified bedrest    face to face counseling and charting  Terri Fester MD

## 2023-10-30 LAB — TYPE AND SCREEN
ABO/RH(D): O POS
Antibody Screen: NEGATIVE

## 2023-10-30 NOTE — Progress Notes (Signed)
 OB Antepartum Progress Note HD#7 Placenta previa, re-admission for 2nd bleed  S: Patient had small amount of bleeding this morning, noted when wiped and then small amount on pad. Denied abdominal pain or cramping. Endorses fetal movement. Feeling overall well without complaints.   O:  Vitals:   10/30/23 1116 10/30/23 1117  BP: (!) 92/58 (!) 92/58  Pulse: (!) 109 (!) 103  Resp: 16   Temp: 97.8 F (36.6 C)   SpO2: 98%    Physical Exam: GA: well appearing, NAD  Chest: normal work of breathing on room air Abd: gravid, NT Perineum: pad with scant dark red blood, ~10% filled LE: no signs of DVT b/l   PM NST: reactive AM NST: reactive  Put on monitor again with small bleed - reactive w/no contractions.   MFM Growth US  on 4/5: Cephalic complete post placenta previa, EFW 11% AC 16%  MFM US  4/22: Cephalic BPP 8/8 post placenta previa  T&S: Rh pos, last 4/23  Lab Results  Component Value Date   WBC 6.8 10/24/2023   HGB 10.1 (L) 10/24/2023   HCT 29.4 (L) 10/24/2023   MCV 97.4 10/24/2023   PLT 182 10/24/2023   A/P: 38yo G4P3003 at [redacted]w[redacted]d admitted for 2nd vaginal bleed with placenta previa. Patient stable with two small episodes of bleeding this morning and yesterday. Planning C/S 4/30 w/primary provider, Dr. Harless Lien, but may be sooner if bleeding worsens.   # Placenta Previa  - Continue monitoring - Active T&S - Plan for delivery via C/S at 36w, earlier PRN if bleeding worsens  # Neonate/prematurity -  s/p BMZ 4/5-4/6 - s/p NICU consult - BID NST   Ellsworth Haas, MD

## 2023-10-31 NOTE — Progress Notes (Signed)
 OB Antepartum Progress Note HD#9 Placenta previa, re-admission for 2nd bleed   S: No further bleeding since yesterday at 4p. Denied abdominal pain or cramping. Endorses fetal movement. Feeling overall well without complaints.    O:  Vitals:   10/30/23 2023 10/30/23 2331  BP: (!) 105/51 (!) 85/40  Pulse: 80 70  Resp: 18 18  Temp: 98 F (36.7 C) 98 F (36.7 C)  SpO2: 99% 99%    Physical Exam: GA: well appearing, NAD  Chest: normal work of breathing on room air Abd: gravid, NT LE: no signs of DVT b/l    PM NST: reactive   MFM Growth US  on 4/5: Cephalic complete post placenta previa, EFW 11% AC 16%  MFM US  4/22: Cephalic BPP 8/8 post placenta previa   T&S: Rh pos, last 4/26   Recent Labs       Lab Results  Component Value Date    WBC 6.8 10/24/2023    HGB 10.1 (L) 10/24/2023    HCT 29.4 (L) 10/24/2023    MCV 97.4 10/24/2023    PLT 182 10/24/2023      Diane [name]: 38yo G4P3003 at [redacted]w[redacted]d admitted for 2nd vaginal bleed with placenta previa. Patient stable with two short episodes of spotting over last two days, currently no bleeding. Planning C/S 4/30 w/primary provider, Dr. Harless Lien, but may be sooner if bleeding worsens.    # Placenta Previa  - Continue monitoring - Active T&S - Plan for delivery via C/S at 36w, earlier PRN if bleeding worsens   # Neonate/prematurity -  s/p BMZ 4/5-4/6 - s/p NICU consult - BID NST    Ellsworth Haas, MD

## 2023-11-01 ENCOUNTER — Other Ambulatory Visit: Payer: Self-pay | Admitting: Obstetrics and Gynecology

## 2023-11-01 NOTE — Progress Notes (Signed)
 HD 9, placenta previa, readmit for 2nd episode of bleeding  Pt c/o small amount of vaginal bleeding this am about 0800- small amt into pad red, then about 0930 another small bleeding episode but not bright red and seemed residual from prior bleeding (per both nursing and pt)- per nursing small area on pad about dollar coin, second bleeding was about the same She also had a pain in llq but didn't last long and resolved; denies lof/ctx +FM  Feels good  Patient Vitals for the past 24 hrs:  BP Temp Temp src Pulse Resp SpO2  11/01/23 0753 (!) 104/50 97.8 F (36.6 C) Oral 68 18 100 %  11/01/23 0540 (!) 94/43 97.9 F (36.6 C) Oral 66 16 98 %  10/31/23 1941 (!) 98/55 98.1 F (36.7 C) Oral 78 17 99 %  10/31/23 1515 (!) 95/54 98 F (36.7 C) -- 83 18 99 %  10/31/23 1100 103/65 98 F (36.7 C) Oral 97 18 98 %   A&ox3 Nml respirations Abd: soft,nt,nd, gravid LE: no edema,nt bilat  NST: 4/27, 2130 FHT: 120s, nml variability, +accels, no decels TOCO:irritability, irreg ctx  4/28 0750 - started as nst then contin monitoring d/t light bleeding FHT: 130s, nml variability, +accels, no decels, few very small variables TOCO: few irreg ctx   MFM Growth US  on 4/5: Cephalic complete post placenta previa, EFW 11% AC 16%  MFM US  4/22: Cephalic BPP 8/8 post placenta previa  Type/screen: 4/22, rh pos  A/P: 38 y.o. G4P3003 at [redacted]w[redacted]d HD# 9 re-admission for VB in setting of placenta previa, 2nd bleeding episode, stable   Placenta Previa - stable, with slight bleed overnight, Planning scheduled C/s on 4/30 or 5/1 per Dr Harless Lien (I informed him of first episode this am and he informed me of plan and asked that I see pt this am on his behalf), pt aware sooner if bleeding persists/increases. Understands plan, waiting to hear back from Dr Harless Lien re: exact timing of delivery. Reassuring fetal status,icontin npatient until delivery;  2.   Prematurity: s/p BMZ 4/5-4/6, s/p NICU consult FGR vs SGA -S/p Growth US   in office around 32 weeks EFW 6% and repeated on admission 4/5 EFW 11% with normal UAD, BPP 8/8; fetal status reassuring, continue tid monitoring, wkly bpp (plan tomorrow am) Antepartum Care:  -ate breakfast - hold food until lunch to see if further bleeding -PNV daily -Bowel regimen, minimize any excessive strain -Modified bedrest  5. Rh positive 6. GBS positive   face to face counseling and charting

## 2023-11-02 ENCOUNTER — Inpatient Hospital Stay (HOSPITAL_BASED_OUTPATIENT_CLINIC_OR_DEPARTMENT_OTHER)

## 2023-11-02 DIAGNOSIS — Z3A35 35 weeks gestation of pregnancy: Secondary | ICD-10-CM

## 2023-11-02 DIAGNOSIS — O365930 Maternal care for other known or suspected poor fetal growth, third trimester, not applicable or unspecified: Secondary | ICD-10-CM | POA: Diagnosis not present

## 2023-11-02 DIAGNOSIS — O4413 Placenta previa with hemorrhage, third trimester: Secondary | ICD-10-CM

## 2023-11-02 DIAGNOSIS — O09523 Supervision of elderly multigravida, third trimester: Secondary | ICD-10-CM

## 2023-11-02 LAB — CBC
HCT: 32.9 % — ABNORMAL LOW (ref 36.0–46.0)
Hemoglobin: 11.4 g/dL — ABNORMAL LOW (ref 12.0–15.0)
MCH: 33.5 pg (ref 26.0–34.0)
MCHC: 34.7 g/dL (ref 30.0–36.0)
MCV: 96.8 fL (ref 80.0–100.0)
Platelets: 196 10*3/uL (ref 150–400)
RBC: 3.4 MIL/uL — ABNORMAL LOW (ref 3.87–5.11)
RDW: 13.3 % (ref 11.5–15.5)
WBC: 6.2 10*3/uL (ref 4.0–10.5)
nRBC: 0 % (ref 0.0–0.2)

## 2023-11-02 NOTE — Progress Notes (Signed)
 Patient seen and examined.  Consent witnessed and signed.  No changes noted. Update completed.  BP (!) 103/59 (BP Location: Right Arm)   Pulse 85   Temp 98.7 F (37.1 C) (Oral)   Resp 18   Ht 5\' 2"  (1.575 m)   Wt 59.9 kg   SpO2 100%   BMI 24.14 kg/m  CBC    Component Value Date/Time   WBC 6.2 11/02/2023 1700   RBC 3.40 (L) 11/02/2023 1700   HGB 11.4 (L) 11/02/2023 1700   HCT 32.9 (L) 11/02/2023 1700   PLT 196 11/02/2023 1700   MCV 96.8 11/02/2023 1700   MCH 33.5 11/02/2023 1700   MCHC 34.7 11/02/2023 1700   RDW 13.3 11/02/2023 1700   LYMPHSABS 0.4 (L) 08/14/2023 2036   MONOABS 0.3 08/14/2023 2036   EOSABS 0.0 08/14/2023 2036   BASOSABS 0.0 08/14/2023 2036   Risks of anesthesia , infection, bleeding, injury to surrounding organs with need for repair discussed. Possible need for blood products with transfusion risks noted.

## 2023-11-02 NOTE — Anesthesia Preprocedure Evaluation (Signed)
 Anesthesia Evaluation  Patient identified by MRN, date of birth, ID band Patient awake    Reviewed: Allergy & Precautions, NPO status , Patient's Chart, lab work & pertinent test results  Airway Mallampati: I       Dental no notable dental hx.    Pulmonary    Pulmonary exam normal        Cardiovascular negative cardio ROS Normal cardiovascular exam     Neuro/Psych    GI/Hepatic negative GI ROS,,,  Endo/Other    Renal/GU      Musculoskeletal   Abdominal   Peds  Hematology  (+) Blood dyscrasia, anemia   Anesthesia Other Findings Placenta previa  Reproductive/Obstetrics (+) Pregnancy                             Anesthesia Physical Anesthesia Plan  ASA: 2  Anesthesia Plan: Spinal   Post-op Pain Management: Ofirmev  IV (intra-op)* and Toradol  IV (intra-op)*   Induction: Intravenous  PONV Risk Score and Plan: 3 and Ondansetron  and Treatment may vary due to age or medical condition  Airway Management Planned: Natural Airway  Additional Equipment: None  Intra-op Plan:   Post-operative Plan:   Informed Consent: I have reviewed the patients History and Physical, chart, labs and discussed the procedure including the risks, benefits and alternatives for the proposed anesthesia with the patient or authorized representative who has indicated his/her understanding and acceptance.       Plan Discussed with: CRNA  Anesthesia Plan Comments: (Lab Results      Component                Value               Date                      WBC                      6.8                 10/24/2023                HGB                      10.1 (L)            10/24/2023                HCT                      29.4 (L)            10/24/2023                MCV                      97.4                10/24/2023                PLT                      182                 10/24/2023           )        Anesthesia Quick Evaluation

## 2023-11-02 NOTE — Progress Notes (Addendum)
 OB Antepartum Progress Note HD#11 Placenta previa  S: Patient doing well and reports no complaints today. No vaginal bleeding, contractions or leakage of fluid. Endorses fetal movement.   O:  Vitals:   11/02/23 1218 11/02/23 1220  BP: 113/65   Pulse: 77   Resp: 18   Temp: 98.2 F (36.8 C)   SpO2: 99% 100%   Physical exam: GA: well appearing, NAD  Chest: normal work of breathing on room air Abd: gravid, NT LE: no signs of DVT b/l   AM NST: reactive; baseline 135bpm, moderate variability, + accels, - decels Toco: uterine irritability  Imaging:  OB US  this morning: BPP 6/8 (-2 for fetal breathing) per patient (official read not resulted  yet)  MFM Growth US  on 4/5: Cephalic complete post placenta previa, EFW 11% AC 16%  MFM US  4/22: Cephalic BPP 8/8 post placenta previa   T&S: Rh pos, last 4/26 GBS: positive  Lab Results  Component Value Date   WBC 6.8 10/24/2023   HGB 10.1 (L) 10/24/2023   HCT 29.4 (L) 10/24/2023   MCV 97.4 10/24/2023   PLT 182 10/24/2023   A/P: 38yo [id number] at [redacted]w[redacted]d admitted for 2nd vaginal bleed with placenta previa. Patient stable with currently no bleeding. Planning C/S tomorrow @ [phone] w/primary provider, Dr. Taavon, but may be sooner if bleeding worsens.    # Placenta Previa  - Continue monitoring - Active T&S - Plan for delivery via C/S tomorrow AM, earlier PRN if bleeding worsens - NPO @ MN    # Neonate/prematurity -  s/p BMZ 4/5-4/6 - s/p NICU consult - BID NST   Ellsworth Haas, MD

## 2023-11-03 ENCOUNTER — Inpatient Hospital Stay (HOSPITAL_COMMUNITY): Payer: Self-pay | Admitting: Anesthesiology

## 2023-11-03 ENCOUNTER — Other Ambulatory Visit: Payer: Self-pay

## 2023-11-03 ENCOUNTER — Encounter (HOSPITAL_COMMUNITY): Payer: Self-pay | Admitting: Obstetrics & Gynecology

## 2023-11-03 ENCOUNTER — Encounter (HOSPITAL_COMMUNITY)
Admission: AD | Disposition: A | Discharge status: Home / Self Care | Payer: Self-pay | Source: Home / Self Care | Attending: Obstetrics and Gynecology

## 2023-11-03 DIAGNOSIS — O4400 Placenta previa specified as without hemorrhage, unspecified trimester: Secondary | ICD-10-CM | POA: Diagnosis present

## 2023-11-03 DIAGNOSIS — O4403 Placenta previa specified as without hemorrhage, third trimester: Secondary | ICD-10-CM

## 2023-11-03 DIAGNOSIS — Z3A36 36 weeks gestation of pregnancy: Secondary | ICD-10-CM

## 2023-11-03 LAB — PREPARE RBC (CROSSMATCH)

## 2023-11-03 SURGERY — Surgical Case
Anesthesia: Spinal

## 2023-11-03 MED ORDER — STERILE WATER FOR IRRIGATION IR SOLN
Status: DC | PRN
  Administered 2023-11-03: 1000 mL

## 2023-11-03 MED ORDER — ONDANSETRON HCL 4 MG/2ML IJ SOLN: 4.0000 mg | Freq: Three times a day (TID) | INTRAMUSCULAR | Status: DC | PRN

## 2023-11-03 MED ORDER — METHYLERGONOVINE MALEATE 0.2 MG/ML IJ SOLN: 0.2000 mg | INTRAMUSCULAR | Status: DC | PRN

## 2023-11-03 MED ORDER — SIMETHICONE 80 MG PO CHEW
80.0000 mg | CHEWABLE_TABLET | Freq: Three times a day (TID) | ORAL | Status: DC
  Administered 2023-11-03 – 2023-11-05 (×6): 80 mg via ORAL
  Filled 2023-11-03 (×6): qty 1

## 2023-11-03 MED ORDER — LACTATED RINGERS IV SOLN: INTRAVENOUS | Status: DC

## 2023-11-03 MED ORDER — VASOPRESSIN 20 UNIT/ML IV SOLN
INTRAVENOUS | Status: DC | PRN
  Administered 2023-11-03: 10 [IU] via INTRAMUSCULAR

## 2023-11-03 MED ORDER — OXYTOCIN-SODIUM CHLORIDE 30-0.9 UT/500ML-% IV SOLN
INTRAVENOUS | Status: DC | PRN
  Administered 2023-11-03: 300 mL via INTRAVENOUS

## 2023-11-03 MED ORDER — KETOROLAC TROMETHAMINE 30 MG/ML IJ SOLN: 30.0000 mg | Freq: Four times a day (QID) | INTRAMUSCULAR | Status: AC | PRN

## 2023-11-03 MED ORDER — NALOXONE HCL 4 MG/10ML IJ SOLN: 1.0000 ug/kg/h | INTRAVENOUS | Status: DC | PRN

## 2023-11-03 MED ORDER — POVIDONE-IODINE 10 % EX SWAB: 2.0000 | Freq: Once | CUTANEOUS | Status: DC

## 2023-11-03 MED ORDER — TRANEXAMIC ACID-NACL 1000-0.7 MG/100ML-% IV SOLN
INTRAVENOUS | Status: AC
Start: 2023-11-03 — End: ?
  Filled 2023-11-03: qty 100

## 2023-11-03 MED ORDER — OXYTOCIN-SODIUM CHLORIDE 30-0.9 UT/500ML-% IV SOLN
INTRAVENOUS | Status: AC
  Filled 2023-11-03: qty 500

## 2023-11-03 MED ORDER — SENNOSIDES-DOCUSATE SODIUM 8.6-50 MG PO TABS
2.0000 | ORAL_TABLET | Freq: Every day | ORAL | Status: DC
  Administered 2023-11-04 – 2023-11-05 (×2): 2 via ORAL
  Filled 2023-11-03 (×2): qty 2

## 2023-11-03 MED ORDER — ACETAMINOPHEN 10 MG/ML IV SOLN
INTRAVENOUS | Status: AC
Start: 2023-11-03 — End: ?
  Filled 2023-11-03: qty 100

## 2023-11-03 MED ORDER — KETOROLAC TROMETHAMINE 30 MG/ML IJ SOLN
30.0000 mg | Freq: Four times a day (QID) | INTRAMUSCULAR | Status: AC
  Administered 2023-11-03 – 2023-11-04 (×4): 30 mg via INTRAVENOUS
  Filled 2023-11-03 (×4): qty 1

## 2023-11-03 MED ORDER — MENTHOL 3 MG MT LOZG: 1.0000 | LOZENGE | OROMUCOSAL | Status: DC | PRN

## 2023-11-03 MED ORDER — SODIUM CHLORIDE 0.9% FLUSH
INTRAVENOUS | Status: DC | PRN
  Administered 2023-11-03: 40 mL

## 2023-11-03 MED ORDER — IBUPROFEN 600 MG PO TABS
600.0000 mg | ORAL_TABLET | Freq: Four times a day (QID) | ORAL | Status: DC
  Administered 2023-11-04 – 2023-11-05 (×5): 600 mg via ORAL
  Filled 2023-11-03 (×5): qty 1

## 2023-11-03 MED ORDER — BUPIVACAINE HCL (PF) 0.25 % IJ SOLN
INTRAMUSCULAR | Status: AC
  Filled 2023-11-03: qty 30

## 2023-11-03 MED ORDER — WITCH HAZEL-GLYCERIN EX PADS: 1.0000 | MEDICATED_PAD | CUTANEOUS | Status: DC | PRN

## 2023-11-03 MED ORDER — DIPHENHYDRAMINE HCL 25 MG PO CAPS: 25.0000 mg | ORAL_CAPSULE | ORAL | Status: DC | PRN

## 2023-11-03 MED ORDER — LIDOCAINE 2% (20 MG/ML) 5 ML SYRINGE
INTRAMUSCULAR | Status: AC
  Filled 2023-11-03: qty 5

## 2023-11-03 MED ORDER — ACETAMINOPHEN 500 MG PO TABS
1000.0000 mg | ORAL_TABLET | Freq: Four times a day (QID) | ORAL | Status: DC
  Administered 2023-11-03 – 2023-11-05 (×8): 1000 mg via ORAL
  Filled 2023-11-03 (×8): qty 2

## 2023-11-03 MED ORDER — CEFAZOLIN SODIUM-DEXTROSE 2-4 GM/100ML-% IV SOLN
INTRAVENOUS | Status: AC
  Filled 2023-11-03: qty 100

## 2023-11-03 MED ORDER — FENTANYL CITRATE (PF) 100 MCG/2ML IJ SOLN
INTRAMUSCULAR | Status: AC
  Filled 2023-11-03: qty 2

## 2023-11-03 MED ORDER — SIMETHICONE 80 MG PO CHEW: 80.0000 mg | CHEWABLE_TABLET | ORAL | Status: DC | PRN

## 2023-11-03 MED ORDER — PHENYLEPHRINE HCL-NACL 20-0.9 MG/250ML-% IV SOLN
INTRAVENOUS | Status: AC
  Filled 2023-11-03: qty 250

## 2023-11-03 MED ORDER — OXYTOCIN-SODIUM CHLORIDE 30-0.9 UT/500ML-% IV SOLN: 2.5000 [IU]/h | INTRAVENOUS | Status: AC

## 2023-11-03 MED ORDER — BUPIVACAINE IN DEXTROSE 0.75-8.25 % IT SOLN
INTRATHECAL | Status: DC | PRN
  Administered 2023-11-03: 1.5 mL via INTRATHECAL

## 2023-11-03 MED ORDER — NALOXONE HCL 0.4 MG/ML IJ SOLN: 0.4000 mg | INTRAMUSCULAR | Status: DC | PRN

## 2023-11-03 MED ORDER — DIPHENHYDRAMINE HCL 25 MG PO CAPS: 25.0000 mg | ORAL_CAPSULE | Freq: Four times a day (QID) | ORAL | Status: DC | PRN

## 2023-11-03 MED ORDER — ONDANSETRON HCL 4 MG/2ML IJ SOLN
INTRAMUSCULAR | Status: DC | PRN
  Administered 2023-11-03: 4 mg via INTRAVENOUS

## 2023-11-03 MED ORDER — DEXAMETHASONE SODIUM PHOSPHATE 10 MG/ML IJ SOLN
INTRAMUSCULAR | Status: DC | PRN
  Administered 2023-11-03 (×2): 5 mg via INTRAVENOUS

## 2023-11-03 MED ORDER — COCONUT OIL OIL: 1.0000 | TOPICAL_OIL | Status: DC | PRN

## 2023-11-03 MED ORDER — PRENATAL MULTIVITAMIN CH
1.0000 | ORAL_TABLET | Freq: Every day | ORAL | Status: DC
  Administered 2023-11-04 – 2023-11-05 (×2): 1 via ORAL
  Filled 2023-11-03 (×2): qty 1

## 2023-11-03 MED ORDER — OXYCODONE HCL 5 MG PO TABS
5.0000 mg | ORAL_TABLET | ORAL | Status: DC | PRN
  Administered 2023-11-04: 10 mg via ORAL
  Administered 2023-11-04 – 2023-11-05 (×4): 5 mg via ORAL
  Filled 2023-11-03 (×7): qty 1

## 2023-11-03 MED ORDER — ACETAMINOPHEN 10 MG/ML IV SOLN
INTRAVENOUS | Status: DC | PRN
Start: 2023-11-03 — End: 2023-11-03
  Administered 2023-11-03: 1000 mg via INTRAVENOUS

## 2023-11-03 MED ORDER — LACTATED RINGERS IV SOLN: INTRAVENOUS | Status: DC | PRN

## 2023-11-03 MED ORDER — TRANEXAMIC ACID-NACL 1000-0.7 MG/100ML-% IV SOLN
INTRAVENOUS | Status: DC | PRN
  Administered 2023-11-03: 1000 mg via INTRAVENOUS

## 2023-11-03 MED ORDER — SODIUM CHLORIDE 0.9% FLUSH: 3.0000 mL | INTRAVENOUS | Status: DC | PRN

## 2023-11-03 MED ORDER — MORPHINE SULFATE (PF) 0.5 MG/ML IJ SOLN
INTRAMUSCULAR | Status: AC
  Filled 2023-11-03: qty 10

## 2023-11-03 MED ORDER — EPHEDRINE SULFATE-NACL 50-0.9 MG/10ML-% IV SOSY
PREFILLED_SYRINGE | INTRAVENOUS | Status: DC | PRN
  Administered 2023-11-03: 5 mg via INTRAVENOUS

## 2023-11-03 MED ORDER — PHENYLEPHRINE HCL-NACL 20-0.9 MG/250ML-% IV SOLN
INTRAVENOUS | Status: DC | PRN
  Administered 2023-11-03: 60 ug/min via INTRAVENOUS

## 2023-11-03 MED ORDER — CEFAZOLIN SODIUM-DEXTROSE 2-4 GM/100ML-% IV SOLN
2.0000 g | INTRAVENOUS | Status: AC
  Administered 2023-11-03: 2 g via INTRAVENOUS
  Filled 2023-11-03: qty 100

## 2023-11-03 MED ORDER — DIBUCAINE (PERIANAL) 1 % EX OINT: 1.0000 | TOPICAL_OINTMENT | CUTANEOUS | Status: DC | PRN

## 2023-11-03 MED ORDER — DIPHENHYDRAMINE HCL 50 MG/ML IJ SOLN: 12.5000 mg | INTRAMUSCULAR | Status: DC | PRN

## 2023-11-03 MED ORDER — MORPHINE SULFATE (PF) 0.5 MG/ML IJ SOLN
INTRAMUSCULAR | Status: DC | PRN
  Administered 2023-11-03: 150 ug via INTRATHECAL

## 2023-11-03 MED ORDER — EPHEDRINE 5 MG/ML INJ
INTRAVENOUS | Status: AC
  Filled 2023-11-03: qty 5

## 2023-11-03 MED ORDER — SCOPOLAMINE 1 MG/3DAYS TD PT72: 1.0000 | MEDICATED_PATCH | Freq: Once | TRANSDERMAL | Status: DC

## 2023-11-03 MED ORDER — ZOLPIDEM TARTRATE 5 MG PO TABS: 5.0000 mg | ORAL_TABLET | Freq: Every evening | ORAL | Status: DC | PRN

## 2023-11-03 MED ORDER — METHYLERGONOVINE MALEATE 0.2 MG PO TABS: 0.2000 mg | ORAL_TABLET | ORAL | Status: DC | PRN

## 2023-11-03 MED ORDER — FENTANYL CITRATE (PF) 100 MCG/2ML IJ SOLN
INTRAMUSCULAR | Status: DC | PRN
  Administered 2023-11-03: 15 ug via INTRATHECAL

## 2023-11-03 MED ORDER — ONDANSETRON HCL 4 MG/2ML IJ SOLN
INTRAMUSCULAR | Status: AC
  Filled 2023-11-03: qty 2

## 2023-11-03 SURGICAL SUPPLY — 33 items
BENZOIN TINCTURE PRP APPL 2/3 (GAUZE/BANDAGES/DRESSINGS) IMPLANT
CHLORAPREP W/TINT 26 (MISCELLANEOUS) ×2 IMPLANT
CLAMP UMBILICAL CORD (MISCELLANEOUS) ×1 IMPLANT
CLOTH BEACON ORANGE TIMEOUT ST (SAFETY) ×1 IMPLANT
DRSG OPSITE POSTOP 4X10 (GAUZE/BANDAGES/DRESSINGS) ×1 IMPLANT
ELECTRODE REM PT RTRN 9FT ADLT (ELECTROSURGICAL) ×1 IMPLANT
EXTRACTOR VACUUM KIWI (MISCELLANEOUS) IMPLANT
GLOVE BIOGEL PI IND STRL 7.0 (GLOVE) ×1 IMPLANT
GLOVE SURG LTX SZ8 (GLOVE) ×1 IMPLANT
GOWN STRL REUS W/TWL LRG LVL3 (GOWN DISPOSABLE) ×2 IMPLANT
KIT ABG SYR 3ML LUER SLIP (SYRINGE) IMPLANT
MAT PREVALON FULL STRYKER (MISCELLANEOUS) IMPLANT
NDL HYPO 25X5/8 SAFETYGLIDE (NEEDLE) IMPLANT
NDL SPNL 20GX3.5 QUINCKE YW (NEEDLE) IMPLANT
NEEDLE HYPO 22GX1.5 SAFETY (NEEDLE) ×1 IMPLANT
NEEDLE HYPO 25X5/8 SAFETYGLIDE (NEEDLE) IMPLANT
NEEDLE SPNL 20GX3.5 QUINCKE YW (NEEDLE) IMPLANT
NS IRRIG 1000ML POUR BTL (IV SOLUTION) ×1 IMPLANT
PACK C SECTION WH (CUSTOM PROCEDURE TRAY) ×1 IMPLANT
STRIP CLOSURE SKIN 1/2X4 (GAUZE/BANDAGES/DRESSINGS) IMPLANT
SUT MNCRL 0 VIOLET CTX 36 (SUTURE) ×2 IMPLANT
SUT MNCRL AB 3-0 PS2 27 (SUTURE) IMPLANT
SUT MON AB 2-0 CT1 27 (SUTURE) ×1 IMPLANT
SUT MON AB-0 CT1 36 (SUTURE) ×2 IMPLANT
SUT PDS AB 0 CT1 27 (SUTURE) IMPLANT
SUT PLAIN 0 NONE (SUTURE) IMPLANT
SUT PLAIN 2 0 XLH (SUTURE) IMPLANT
SUT PLAIN ABS 2-0 CT1 27XMFL (SUTURE) IMPLANT
SUT VIC AB 4-0 KS 27 (SUTURE) IMPLANT
SYR CONTROL 10ML LL (SYRINGE) ×1 IMPLANT
TOWEL OR 17X24 6PK STRL BLUE (TOWEL DISPOSABLE) ×1 IMPLANT
TRAY FOLEY W/BAG SLVR 14FR LF (SET/KITS/TRAYS/PACK) ×1 IMPLANT
WATER STERILE IRR 1000ML POUR (IV SOLUTION) ×1 IMPLANT

## 2023-11-03 NOTE — Transfer of Care (Signed)
 Immediate Anesthesia Transfer of Care Note  Patient: Diane [name]  Procedure(s) Performed: CESAREAN DELIVERY  Patient Location: PACU  Anesthesia Type:Spinal  Level of Consciousness: awake  Airway & Oxygen Therapy: Patient Spontanous Breathing  Post-op Assessment: Report given to RN and Post -op Vital signs reviewed and stable  Post vital signs: Reviewed and stable  Last Vitals:  Vitals Value Taken Time  BP    Temp    Pulse    Resp    SpO2      Last Pain:  Vitals:   11/03/23 0726  TempSrc: Oral  PainSc:       Patients Stated Pain Goal: 3 (10/28/23 0817)  Complications: No notable events documented.

## 2023-11-03 NOTE — Anesthesia Procedure Notes (Signed)
 Spinal  Start time: 11/03/2023 7:48 AM End time: 11/03/2023 7:50 AM Reason for block: surgical anesthesia Staffing Performed: anesthesiologist  Anesthesiologist: Willian Harrow, MD Performed by: Willian Harrow, MD Authorized by: Willian Harrow, MD   Preanesthetic Checklist Completed: patient identified, IV checked, site marked, risks and benefits discussed, surgical consent, monitors and equipment checked, pre-op evaluation and timeout performed Spinal Block Patient position: sitting Prep: DuraPrep and site prepped and draped Location: L3-4 Injection technique: single-shot Needle Needle type: Pencan  Needle gauge: 24 G Needle length: 10 cm Needle insertion depth: 10 cm Additional Notes Patient tolerated well. No immediate complications.  Functioning IV was confirmed and monitors were applied. Sterile prep and drape, including hand hygiene and sterile gloves were used. The patient was positioned and the back was prepped. The skin was anesthetized with lidocaine . Free flow of clear CSF was obtained prior to injecting local anesthetic into the CSF. The spinal needle aspirated freely following injection. The needle was carefully withdrawn. The patient tolerated the procedure well.

## 2023-11-03 NOTE — Anesthesia Postprocedure Evaluation (Signed)
 Anesthesia Post Note  Patient: Diane [name]  Procedure(s) Performed: CESAREAN DELIVERY     Patient location during evaluation: PACU Anesthesia Type: Spinal Level of consciousness: oriented and awake and alert Pain management: pain level controlled Vital Signs Assessment: post-procedure vital signs reviewed and stable Respiratory status: spontaneous breathing, respiratory function stable and patient connected to nasal cannula oxygen Cardiovascular status: blood pressure returned to baseline and stable Postop Assessment: no headache, no backache and no apparent nausea or vomiting Anesthetic complications: no  No notable events documented.  Last Vitals:  Vitals:   11/03/23 0945 11/03/23 0956  BP: (!) 86/52 (!) 85/52  Pulse: 71 69  Resp: 11 15  Temp:  (!) 36 C  SpO2: 96% 99%    Last Pain:  Vitals:   11/03/23 0959  TempSrc:   PainSc: 0-No pain   Pain Goal: Patients Stated Pain Goal: 3 (10/28/23 0817)  LLE Motor Response: No movement to painful stimulus, No movement due to regional block (11/03/23 0945) LLE Sensation: Tingling (11/03/23 0945) RLE Motor Response: No movement to painful stimulus, No movement due to regional block (11/03/23 0945) RLE Sensation: Tingling (11/03/23 0945)     Epidural/Spinal Function Cutaneous sensation: Tingles (11/03/23 0959), Patient able to flex knees: Yes (11/03/23 0959), Patient able to lift hips off bed: No (11/03/23 0959), Back pain beyond tenderness at insertion site: No (11/03/23 0959), Progressively worsening motor and/or sensory loss: No (11/03/23 0959), Bowel and/or bladder incontinence post epidural: No (11/03/23 0959)  Willian Harrow

## 2023-11-03 NOTE — Op Note (Signed)
 Cesarean Section Procedure Note  Indications: placenta previa  Pre-operative Diagnosis: 36 week 0 day pregnancy.  Post-operative Diagnosis: same  Surgeon: Camillo Celestine   Assistants: Rochelle Chu, CNM  Anesthesia: Local anesthesia 0.25.% bupivacaine  and Spinal anesthesia  ASA Class: 2  Procedure Details  The patient was seen in the Holding Room. The risks, benefits, complications, treatment options, and expected outcomes were discussed with the patient.  The patient concurred with the proposed plan, giving informed consent. The risks of anesthesia, infection, bleeding and possible injury to other organs discussed. Injury to bowel, bladder, or ureter with possible need for repair discussed. Possible need for transfusion with secondary risks of hepatitis or HIV acquisition discussed. Post operative complications to include but not limited to DVT, PE and Pneumonia noted. The site of surgery properly noted/marked. The patient was taken to Operating Room # B, identified as Diane [name] and the procedure verified as C-Section Delivery. A Time Out was held and the above information confirmed.  After induction of anesthesia, the patient was draped and prepped in the usual sterile manner. A Pfannenstiel incision was made and carried down through the subcutaneous tissue to the fascia. Fascial incision was made and extended transversely using Mayo scissors. The fascia was separated from the underlying rectus tissue superiorly and inferiorly. The peritoneum was identified and entered. Peritoneal incision was extended longitudinally. The utero-vesical peritoneal reflection was incised transversely and the bladder flap was bluntly freed from the lower uterine segment. A low transverse uterine incision(Kerr hysterotomy) was made. Delivered from OA presentation was a  female with Apgar scores of 8 at one minute and 9 at five minutes. Bulb suctioning gently performed. Neonatal team in attendance.After the umbilical  cord was clamped and cut cord blood was obtained for evaluation. The placenta was removed intact and appeared normal. The uterus was curetted with a dry lap pack. Good hemostasis was noted.The uterine outline, tubes and ovaries appeared normal. Square sutures x 2 on post wall endometrial surface for hemostasis. Vasopressin used for hemostasis.The uterine incision was closed with running locked sutures of 0 Monocryl x 1 layers. Hemostasis was observed. Lavage was carried out until clear The fascia was then reapproximated with running sutures of 0 Monocryl. The skin was reapproximated with 4-0 vicryl after [place] closure with 2-0 plain..  Instrument, sponge, and needle counts were correct prior the abdominal closure and at the conclusion of the case.   Findings: FTLF, OA, post previa. PDS x 2 on post wall. Nl adnexa.  Estimated Blood Loss:           Drains: foley                 Specimens: placenta to path                 Complications:  None; patient tolerated the procedure well.         Disposition: PACU - hemodynamically stable.         Condition: stable  Attending Attestation: I performed the procedure.

## 2023-11-03 NOTE — Lactation Note (Signed)
 This note was copied from a baby's chart. Lactation Consultation Note  Patient Name: Diane [name] [id number] Date: 11/03/2023 Age:36 hours Reason for consult: Initial assessment;Late-preterm 34-36.6wks;Infant < 6lbs  P1- Infant was born at [redacted]w[redacted]d weighing 2430g. MOB planned to breast feed only, but is now offering supplementation due to LPI/low birth weight guidelines. MOB reports that infant has been nursing well so far, but has been very sleepy. LC reviewed the expectations of having an LPI/low birth weight infant. Per the RN, MOB declined being set up with the hospital DEBP, but has a manual pump and is okay with using that at this time. LC reviewed how to use the DBM safely with infant. LC demonstrated how to pace feed infant  in a side lying position with the white Nfant nipple. Out of 12 mL, infant was able to take 6 mL. LC noted audible gulping even when pace feeding infant.   Feeding plan as follows: -Entire feeding to last 30 minutes or less. (15 minutes breast and 15 minutes bottle) Timing may be adjusted according to how well infant is doing. -Pump or hand express after every feeding to offer further stimulation. -Supplementation for day 1 is a minimum of 12-14 mL, day 2 is 18-21 mL and day 3 is 24-28 mL.  LC reviewed the first 24 hr birthday nap, cluster feeding, LPI/Low birth weight guidelines, feeding infant on cue 8-12x in 24 hrs, not allowing infant to go over 3 hrs without a feeding, CDC milk storage guidelines, LC services handout and engorgement/breast care. LC encouraged MOB to call for further assistance as needed.  Maternal Data Has patient been taught Hand Expression?: Yes Does the patient have breastfeeding experience prior to this delivery?: Yes How long did the patient breastfeed?: 7 months with first child and 6 months with the second two children  Feeding Mother's Current Feeding Choice: Breast Milk and Donor Milk Nipple Type: Nfant Standard Flow  (white)  Lactation Tools Discussed/Used Tools: Pump;Flanges Flange Size: 24 (MOB's true size is an 18 mm flange, but she wants to use the 24 mm flange) Breast pump type: Manual (MOB declined using a DEBP at this time per RN) Pump Education: Setup, frequency, and cleaning;Milk Storage Reason for Pumping: LPI/low birth weight infant Pumping frequency: 15-20 min every 3 hrs  Interventions Interventions: Breast feeding basics reviewed;Hand pump;Education;Pace feeding;LC Services brochure;LPT handout/interventions  Discharge Discharge Education: Engorgement and breast care;Warning signs for feeding baby Pump: DEBP;Hands Free;Personal  Consult Status Consult Status: Follow-up Date: 11/04/23 Follow-up type: In-patient    Vernette Goo BS, IBCLC 11/03/2023, 7:27 PM

## 2023-11-04 LAB — CBC
HCT: 24.7 % — ABNORMAL LOW (ref 36.0–46.0)
Hemoglobin: 8.6 g/dL — ABNORMAL LOW (ref 12.0–15.0)
MCH: 33.9 pg (ref 26.0–34.0)
MCHC: 34.8 g/dL (ref 30.0–36.0)
MCV: 97.2 fL (ref 80.0–100.0)
Platelets: 160 10*3/uL (ref 150–400)
RBC: 2.54 MIL/uL — ABNORMAL LOW (ref 3.87–5.11)
RDW: 13.4 % (ref 11.5–15.5)
WBC: 8.6 10*3/uL (ref 4.0–10.5)
nRBC: 0 % (ref 0.0–0.2)

## 2023-11-04 LAB — RPR: RPR Ser Ql: NONREACTIVE

## 2023-11-04 NOTE — Progress Notes (Signed)
 No c/o; tol po, ambulating, pain controlled +flatus, voids w/o difficulty Breastfeeding  Patient Vitals for the past 24 hrs:  BP Temp Temp src Pulse Resp SpO2  11/04/23 0500 (!) 79/55 97.7 F (36.5 C) Oral -- 16 --  11/04/23 0145 (!) 84/57 98.4 F (36.9 C) Oral -- 16 --  11/03/23 2050 (!) 77/55 98 F (36.7 C) Oral 60 16 99 %  11/03/23 1740 (!) 80/50 98.4 F (36.9 C) Oral (!) 56 17 98 %  11/03/23 1308 -- -- -- -- -- 97 %  11/03/23 1212 (!) 89/54 (!) 97.5 F (36.4 C) Oral 63 16 100 %  11/03/23 1050 (!) 85/54 -- -- 60 16 100 %  11/03/23 0956 (!) 85/52 (!) 96.8 F (36 C) Axillary 69 15 99 %   Intake/Output Summary (Last 24 hours) at 11/04/2023 0945 Last data filed at 11/04/2023 0200 Gross per 24 hour  Intake --  Output 1450 ml  Net -1450 ml   A&ox3 Rrr Ctab Abd: soft,nt,nd; +bs; fundus firm and below umb; dressing c/d/I LE: no edema,nt bilat     Latest Ref Rng & Units 11/04/2023    4:11 AM 11/02/2023    5:00 PM 10/24/2023    5:52 PM  CBC  WBC 4.0 - 10.5 K/uL 8.6  6.2  6.8   Hemoglobin 12.0 - 15.0 g/dL 8.6  40.1  02.7   Hematocrit 36.0 - 46.0 % 24.7  32.9  29.4   Platelets 150 - 400 K/uL 160  196  182    A/P: pod1 s/p 1ltcs for placenta previa Doing well, contin care Anemia of pregnancy with abl - significant, increase hydration and plan iron  daily Rh pos Rubella Immune Breastefeeding, girl

## 2023-11-04 NOTE — Lactation Note (Signed)
 This note was copied from a baby's chart. Lactation Consultation Note  Patient Name: Girl Monna Shiels [id number] Date: 11/04/2023 Age:36 hours Reason for consult: Follow-up assessment;Late-preterm 34-36.6wks;Infant < 6lbs;Infant weight loss  P4- Infant was born at [redacted]w[redacted]d weighing [id number] and has had a weight loss of 4.53%. MOB reports that she saw speech today and they switched infant down to a Preemie Dr. Michelene Ahmadi bottle. MOB reports that infant does much better with this bottle than the white Nfant nipple. MOB also reports that infant hasn't been very interested in latching today due to be even more sleepy. LC reviewed how this is common for 36 weekers to not do as well on day 2. MOB has been pumping with the hospital DEBP today and has been collecting about 10 mL per pump session. LC praised MOB for her supply. MOB denies having any questions or concerns at this time. LC reviewed the CDC milk storage guidelines and LC services handout. LC encouraged MOB to call for further assistance as needed.  Maternal Data Has patient been taught Hand Expression?: Yes Does the patient have breastfeeding experience prior to this delivery?: Yes How long did the patient breastfeed?: 7 months with first child and 6 months for the other two children  Feeding Mother's Current Feeding Choice: Breast Milk and Donor Milk  Lactation Tools Discussed/Used Tools: Pump;Flanges Breast pump type: Double-Electric Breast Pump;Manual Pump Education: Setup, frequency, and cleaning;Milk Storage Pumping frequency: 15-20 min every 3 hrs  Interventions Interventions: Breast feeding basics reviewed;Education;LC Services brochure  Discharge Discharge Education: Engorgement and breast care;Warning signs for feeding baby Pump: DEBP;Hands Free;Personal  Consult Status Consult Status: Follow-up Date: 11/05/23 Follow-up type: In-patient    Vernette Goo BS, IBCLC 11/04/2023, 6:53 PM

## 2023-11-05 LAB — SURGICAL PATHOLOGY

## 2023-11-05 MED ORDER — IBUPROFEN 600 MG PO TABS: 600.0000 mg | ORAL_TABLET | Freq: Four times a day (QID) | ORAL | 0 refills | Status: AC

## 2023-11-05 MED ORDER — ACCRUFER 30 MG PO CAPS: 1.0000 | ORAL_CAPSULE | Freq: Every day | ORAL | 0 refills | Status: AC

## 2023-11-05 MED ORDER — OXYCODONE HCL 5 MG PO TABS
5.0000 mg | ORAL_TABLET | ORAL | 0 refills | Status: AC | PRN
Start: 2023-11-05 — End: ?

## 2023-11-05 NOTE — Progress Notes (Signed)
 Subjective: Postpartum Day Two: Cesarean Delivery Patient reports doing well. Pain control better today compared to yesterday. Ambulating without dizziness. VB has been lighter. Spontaneously voiding and passing gas. Tolerating a regular diet well. No HA, CP, or SOB.  Baby girl is doing well at bedside.    Objective: Patient Vitals for the past 24 hrs:  BP Temp Temp src Pulse Resp SpO2  11/05/23 1210 (!) 93/57 98 F (36.7 C) Oral 76 18 99 %  11/05/23 0600 (!) 98/57 98.1 F (36.7 C) Oral 86 18 100 %  11/04/23 2044 104/62 98.3 F (36.8 C) Oral 77 18 100 %  11/04/23 1526 (!) 99/56 98 F (36.7 C) Oral 66 16 100 %   No intake or output data in the 24 hours ending 11/05/23 1332   Physical Exam:  General: alert and no distress Lochia: appropriate Uterine Fundus: firm Incision: healing well, no significant drainage, no dehiscence DVT Evaluation: No evidence of DVT seen on physical exam.  Recent Labs    11/02/23 1700 11/04/23 0411  HGB 11.4* 8.6*  HCT 32.9* 24.7*    Assessment/Plan: Status post Cesarean section. Doing well postoperatively.  Continue current care. Betzabet Imbriano [id number] POD#2 sp primary cesarean section at [redacted]w[redacted]d for placenta previa 1. PPC: cont current PO pain regimen, encourage ambulation, regular diet 2. Acute on chronic blood loss anemia, clinically significant. Asymptomatic with appropriate pp lochia, cont PO Fe 3. RH POS 4. LC support PRN  May consider DC home today if baby is clear for discharge, otherwise plan for DC home tomorrow POD3  Renley Banwart A Tolbert Matheson 11/05/2023, 12:41 PM

## 2023-11-05 NOTE — Lactation Note (Signed)
 This note was copied from a baby's chart. Lactation Consultation Note  Patient Name: Diane [name] [id number] Date: 11/05/2023 Age:36 hours, Dyad for D/C  Reason for consult: Follow-up assessment;Infant weight loss;Late-preterm 34-36.6wks;Infant < 6lbs (7 % weight loss,) LC reviewed the doc flow sheets with parents , WNL for D/C.  LC recommended by tonight start with 40 ml and see if the baby will take it. LC discussed gradually increasing the volume per feeding to stretch the stomach so the baby takes more per feeding.  Per mom has attempted to latch, but the baby doesn't opened her mouth. LC discussed when pace feeding to challenge the baby to open the mouth and eventually the baby will open wider.  LC recommended when the baby is wide awake it's the best time to latch the baby or feed the supplement 1st and then try latching afterwards.  LC reviewed supply and demand, importance of pumping around the feeding time around the clock.  Per mom has her DEBP at home [employer]  .  See below for details .  Maternal Data Has patient been taught Hand Expression?: Yes (experienced breast feeder)  Feeding Mother's Current Feeding Choice: Breast Milk and Donor Milk  LATCH Score - hasn't been latching     Lactation Tools Discussed/Used Tools: Pump;Flanges Flange Size: 24 Breast pump type: Double-Electric Breast Pump;Manual Pump Education: Milk Storage;Setup, frequency, and cleaning Pumped volume: 30 mL  Interventions  Review and education   Discharge Discharge Education: Engorgement and breast care;Warning signs for feeding baby Pump: DEBP;Hands Free;Personal;Manual WIC Program: No  Consult Status Consult Status: Complete Date: 11/05/23    Richarda Chance 11/05/2023, 5:06 PM

## 2023-11-05 NOTE — Discharge Summary (Signed)
 Postpartum Discharge Summary  Date of Service updated     Patient Name: Diane [name] DOB: Mar 01, 1986 MRN: [id number]  Date of admission: 10/24/2023 Delivery date:11/03/2023 Delivering provider: Meriam Stamp Date of discharge: 11/05/2023  Admitting diagnosis: Antepartum hemorrhage from placenta previa [O44.10] Placenta previa antepartum in third trimester [O44.03] Placenta previa [O44.00] Intrauterine pregnancy: [redacted]w[redacted]d     Secondary diagnosis:  Principal Problem:   Antepartum hemorrhage from placenta previa Active Problems:   Placenta previa antepartum in third trimester   Placenta previa  Additional problems: anemia    Discharge diagnosis: Preterm Pregnancy Delivered                                              Complications: None  Hospital course: 38 y/o [id number] admitted at 34.4 for second episode of vaginal bleeding in setting of known placenta previa. Pt s/p bmz 4/5,4/6. She had a stable course and was under observation until 36 wga when she underwent an uncomplicated 1ltcs. She is now pod2 and has met her post op milestones and stable. Baby is stable and cleared for d/c. Plan f/u in 6 wk.  Immunizations administered: Immunization History  Administered Date(s) Administered   Influenza,inj,Quad PF,6+ Mos 03/26/2016   MMR 12/28/2019   PFIZER(Purple Top)SARS-COV-2 Vaccination 10/13/2019, 11/06/2019    Physical exam  Vitals:   11/04/23 1526 11/04/23 2044 11/05/23 0600 11/05/23 1210  BP: (!) 99/56 104/62 (!) 98/57 (!) 93/57  Pulse: 66 77 86 76  Resp: 16 18 18 18   Temp: 98 F (36.7 C) 98.3 F (36.8 C) 98.1 F (36.7 C) 98 F (36.7 C)  TempSrc: Oral Oral Oral Oral  SpO2: 100% 100% 100% 99%  Weight:      Height:       Labs: Lab Results  Component Value Date   WBC 8.6 11/04/2023   HGB 8.6 (L) 11/04/2023   HCT 24.7 (L) 11/04/2023   MCV 97.2 11/04/2023   PLT 160 11/04/2023      Latest Ref Rng & Units 10/09/2023    9:53 AM  CMP  Glucose 70 - 99 mg/dL 83    BUN 6 - 20 mg/dL 5   Creatinine 2.95 - 6.21 mg/dL 3.08   Sodium 657 - 846 mmol/L 135   Potassium 3.5 - 5.1 mmol/L 3.5   Chloride 98 - 111 mmol/L 104   CO2 22 - 32 mmol/L 22   Calcium  8.9 - 10.3 mg/dL 9.3   Total Protein 6.5 - 8.1 g/dL 6.0   Total Bilirubin 0.0 - 1.2 mg/dL 0.6   Alkaline Phos 38 - 126 U/L 85   AST 15 - 41 U/L 20   ALT 0 - 44 U/L 13    Edinburgh Score:    11/05/2023    8:27 AM  Edinburgh Postnatal Depression Scale Screening Tool  I have been able to laugh and see the funny side of things. 0  I have looked forward with enjoyment to things. 0  I have blamed myself unnecessarily when things went wrong. 1  I have been anxious or worried for no good reason. 0  I have felt scared or panicky for no good reason. 0  Things have been getting on top of me. 0  I have been so unhappy that I have had difficulty sleeping. 1  I have felt sad or miserable. 1  I have been  so unhappy that I have been crying. 1  The thought of harming myself has occurred to me. 0  Edinburgh Postnatal Depression Scale Total 4      After visit meds:  Allergies as of 11/05/2023   No Known Allergies      Medication List     STOP taking these medications    acetaminophen  325 MG tablet Commonly known as: Tylenol    docusate sodium  50 MG capsule Commonly known as: COLACE   NIFEdipine  10 MG capsule Commonly known as: Procardia        TAKE these medications    ACCRUFeR 30 MG Caps Generic drug: Ferric Maltol Take 1 capsule (30 mg total) by mouth daily.   cetirizine 10 MG tablet Commonly known as: ZYRTEC Take 10 mg by mouth as needed for allergies.   ibuprofen  600 MG tablet Commonly known as: ADVIL  Take 1 tablet (600 mg total) by mouth every 6 (six) hours.   oxyCODONE  5 MG immediate release tablet Commonly known as: Oxy IR/ROXICODONE  Take 1-2 tablets (5-10 mg total) by mouth every 4 (four) hours as needed for moderate pain (pain score 4-6).   prenatal multivitamin Tabs  tablet Take 1 tablet by mouth daily at 12 noon.         Discharge home in stable condition Infant Feeding: Breast Infant Disposition:home with mother Discharge instruction: per After Visit Summary and Postpartum booklet. Activity: Advance as tolerated. Pelvic rest for 6 weeks.  Diet: routine diet Anticipated Birth Control: Unsure Postpartum Appointment:6 weeks Future Appointments:No future appointments. Follow up Visit:      11/05/2023 Zora Hires, MD

## 2023-11-06 LAB — TYPE AND SCREEN
ABO/RH(D): O POS
Antibody Screen: NEGATIVE
Unit division: 0
Unit division: 0

## 2023-11-06 LAB — BPAM RBC
Blood Product Expiration Date: 202505262359
Blood Product Expiration Date: 202505262359
ISSUE DATE / TIME: 202504300220
ISSUE DATE / TIME: 202504300220
Unit Type and Rh: 5100
Unit Type and Rh: 5100

## 2023-11-16 ENCOUNTER — Telehealth (HOSPITAL_COMMUNITY): Payer: Self-pay

## 2023-11-16 NOTE — Telephone Encounter (Signed)
 11/16/2023 1936  Name: Diane [name] MRN: [id number] DOB: [birth year]  Reason for Call:  Transition of Care Hospital Discharge Call  Contact Status: Patient Contact Status: Message  Language assistant needed:          Follow-Up Questions:    Dimple Francis Postnatal Depression Scale:  In the Past 7 Days:    PHQ2-9 Depression Scale:     Discharge Follow-up:    Post-discharge interventions: NA  Signature  Wadell Guild
